# Patient Record
Sex: Female | Born: 1985 | Race: Black or African American | Hispanic: No | Marital: Single | Smoking: Never smoker
Health system: Southern US, Community
[De-identification: ages and names within clinical notes are randomized; demographics above are authoritative.]

## PROBLEM LIST (undated history)

## (undated) ENCOUNTER — Inpatient Hospital Stay (HOSPITAL_COMMUNITY): Payer: Self-pay

## (undated) DIAGNOSIS — Z8619 Personal history of other infectious and parasitic diseases: Secondary | ICD-10-CM

## (undated) DIAGNOSIS — Z87440 Personal history of urinary (tract) infections: Secondary | ICD-10-CM

## (undated) HISTORY — DX: Personal history of urinary (tract) infections: Z87.440

## (undated) HISTORY — PX: NO PAST SURGERIES: SHX2092

## (undated) HISTORY — DX: Personal history of other infectious and parasitic diseases: Z86.19

---
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
## 2007-04-13 ENCOUNTER — Ambulatory Visit (HOSPITAL_COMMUNITY): Admission: RE | Admit: 2007-04-13 | Discharge: 2007-04-13 | Payer: Self-pay | Admitting: Obstetrics & Gynecology

## 2007-04-26 ENCOUNTER — Ambulatory Visit (HOSPITAL_COMMUNITY): Admission: RE | Admit: 2007-04-26 | Discharge: 2007-04-26 | Payer: Self-pay | Admitting: Obstetrics & Gynecology

## 2007-08-20 ENCOUNTER — Inpatient Hospital Stay (HOSPITAL_COMMUNITY): Admission: AD | Admit: 2007-08-20 | Discharge: 2007-08-23 | Payer: Self-pay | Admitting: Obstetrics & Gynecology

## 2009-12-25 ENCOUNTER — Emergency Department (HOSPITAL_COMMUNITY): Admission: EM | Admit: 2009-12-25 | Discharge: 2009-12-25 | Payer: Self-pay | Admitting: Family Medicine

## 2010-11-05 LAB — POCT URINALYSIS DIP (DEVICE)
Bilirubin Urine: NEGATIVE
Glucose, UA: NEGATIVE mg/dL
Ketones, ur: NEGATIVE mg/dL
Nitrite: NEGATIVE
Protein, ur: 100 mg/dL — AB
Specific Gravity, Urine: 1.02 (ref 1.005–1.030)
Urobilinogen, UA: 1 mg/dL (ref 0.0–1.0)
pH: 6 (ref 5.0–8.0)

## 2011-05-08 LAB — CBC
HCT: 29.6 — ABNORMAL LOW
HCT: 31 — ABNORMAL LOW
Hemoglobin: 10.2 — ABNORMAL LOW
Hemoglobin: 10.7 — ABNORMAL LOW
MCHC: 34.4
MCHC: 34.6
MCV: 90
MCV: 90.1
Platelets: 219
Platelets: 257
RBC: 3.28 — ABNORMAL LOW
RBC: 3.45 — ABNORMAL LOW
RDW: 12.4
RDW: 12.7
WBC: 11 — ABNORMAL HIGH
WBC: 13.9 — ABNORMAL HIGH

## 2011-05-08 LAB — RPR: RPR Ser Ql: NONREACTIVE

## 2011-07-14 ENCOUNTER — Inpatient Hospital Stay (HOSPITAL_COMMUNITY)
Admission: AD | Admit: 2011-07-14 | Discharge: 2011-07-14 | Disposition: A | Discharge status: Home / Self Care | Payer: Self-pay | Source: Ambulatory Visit | Attending: Obstetrics & Gynecology | Admitting: Obstetrics & Gynecology

## 2011-07-14 ENCOUNTER — Encounter (HOSPITAL_COMMUNITY): Payer: Self-pay

## 2011-07-14 DIAGNOSIS — J069 Acute upper respiratory infection, unspecified: Secondary | ICD-10-CM | POA: Insufficient documentation

## 2011-07-14 DIAGNOSIS — O99891 Other specified diseases and conditions complicating pregnancy: Secondary | ICD-10-CM | POA: Insufficient documentation

## 2011-07-14 DIAGNOSIS — Z3201 Encounter for pregnancy test, result positive: Secondary | ICD-10-CM

## 2011-07-14 DIAGNOSIS — O210 Mild hyperemesis gravidarum: Secondary | ICD-10-CM | POA: Insufficient documentation

## 2011-07-14 LAB — POCT PREGNANCY, URINE: Preg Test, Ur: POSITIVE

## 2011-07-14 LAB — URINALYSIS, ROUTINE W REFLEX MICROSCOPIC
Glucose, UA: NEGATIVE mg/dL
Hgb urine dipstick: NEGATIVE
Ketones, ur: 40 mg/dL — AB
Leukocytes, UA: NEGATIVE
Protein, ur: NEGATIVE mg/dL
Specific Gravity, Urine: >1.03 — ABNORMAL HIGH (ref 1.005–1.030)
Urobilinogen, UA: 2 mg/dL — ABNORMAL HIGH (ref 0.0–1.0)
pH: 6 (ref 5.0–8.0)

## 2011-07-14 MED ORDER — PROMETHAZINE HCL 25 MG PO TABS: 12.5000 mg | ORAL_TABLET | Freq: Four times a day (QID) | ORAL | Status: DC | PRN

## 2011-07-14 NOTE — Progress Notes (Signed)
Pt states had +upt at home 2 weeks ago, denies bleeding, has had clear vaginal d/c noted, non-odorous. Has had n/v with eating, intermittent, depends on what foods are eaten. Denies pain.

## 2011-07-14 NOTE — Progress Notes (Signed)
Can't really eat, always throwing up.  Has had a period since Sept. Pos home test a couple  wks  Ago..  Has a cold unsure what to take.

## 2011-07-16 ENCOUNTER — Encounter (HOSPITAL_COMMUNITY): Payer: Self-pay | Admitting: *Deleted

## 2011-07-16 NOTE — Progress Notes (Signed)
Pt called states her RX for Phenergan was not called into her Pharmacy. Consulted with Lilyan Punt NP RX called into [facility]. Phenergan 25mg  po #20 no refills. Pt called and notified.

## 2011-08-19 NOTE — L&D Delivery Note (Signed)
Delivery Note At 6:45 PM a viable female, "Jo Graham", was delivered via Vaginal, Spontaneous Delivery (Presentation: Right Occiput Anterior).  APGAR: 8, 9; weight .   Placenta status: Intact, Spontaneous.  Cord: Loose CAN x1, reduced over vtx at delivery, and cord around body/feet, loosely.  3 vessels with the following complications: None.  Cord pH: NA  Anesthesia: Epidural  Episiotomy: None Lacerations: Right 1st degree periurethral Suture Repair: 3.0 monocryl Est. Blood Loss (mL): 200  Mom to postpartum.  Baby to skin to skin Will continue patient on Macrobid as previously prescribed from office, through 02/23/12.Marland Kitchen  [name] 02/15/2012, 7:25 PM

## 2011-09-17 DIAGNOSIS — IMO0002 Reserved for concepts with insufficient information to code with codable children: Secondary | ICD-10-CM | POA: Insufficient documentation

## 2011-09-29 DIAGNOSIS — O09219 Supervision of pregnancy with history of pre-term labor, unspecified trimester: Secondary | ICD-10-CM

## 2011-10-13 DIAGNOSIS — O09219 Supervision of pregnancy with history of pre-term labor, unspecified trimester: Secondary | ICD-10-CM

## 2011-10-13 LAB — OB RESULTS CONSOLE ABO/RH: RH Type: POSITIVE

## 2011-10-13 LAB — OB RESULTS CONSOLE ANTIBODY SCREEN: Antibody Screen: NEGATIVE

## 2011-10-13 LAB — OB RESULTS CONSOLE HEPATITIS B SURFACE ANTIGEN: Hepatitis B Surface Ag: NEGATIVE

## 2011-10-20 ENCOUNTER — Other Ambulatory Visit (INDEPENDENT_AMBULATORY_CARE_PROVIDER_SITE_OTHER): Payer: Medicaid Other

## 2011-10-20 DIAGNOSIS — O09219 Supervision of pregnancy with history of pre-term labor, unspecified trimester: Secondary | ICD-10-CM

## 2011-10-20 DIAGNOSIS — O0930 Supervision of pregnancy with insufficient antenatal care, unspecified trimester: Secondary | ICD-10-CM | POA: Insufficient documentation

## 2011-10-27 ENCOUNTER — Other Ambulatory Visit: Payer: Medicaid Other

## 2011-11-10 ENCOUNTER — Encounter (INDEPENDENT_AMBULATORY_CARE_PROVIDER_SITE_OTHER): Payer: Medicaid Other | Admitting: Obstetrics and Gynecology

## 2011-11-10 DIAGNOSIS — Z3480 Encounter for supervision of other normal pregnancy, unspecified trimester: Secondary | ICD-10-CM

## 2011-11-10 DIAGNOSIS — O09219 Supervision of pregnancy with history of pre-term labor, unspecified trimester: Secondary | ICD-10-CM

## 2011-11-17 ENCOUNTER — Encounter (INDEPENDENT_AMBULATORY_CARE_PROVIDER_SITE_OTHER): Payer: Medicaid Other

## 2011-11-17 ENCOUNTER — Other Ambulatory Visit (INDEPENDENT_AMBULATORY_CARE_PROVIDER_SITE_OTHER): Payer: Medicaid Other

## 2011-11-17 ENCOUNTER — Encounter: Payer: Medicaid Other | Admitting: Obstetrics and Gynecology

## 2011-11-17 DIAGNOSIS — Z331 Pregnant state, incidental: Secondary | ICD-10-CM

## 2011-11-17 DIAGNOSIS — O09219 Supervision of pregnancy with history of pre-term labor, unspecified trimester: Secondary | ICD-10-CM

## 2011-11-17 LAB — CBC: Hemoglobin: 10.6 g/dL — AB (ref 12.0–16.0)

## 2011-11-17 LAB — RPR: RPR: NONREACTIVE

## 2011-11-24 ENCOUNTER — Other Ambulatory Visit: Payer: Medicaid Other

## 2011-11-24 DIAGNOSIS — O09219 Supervision of pregnancy with history of pre-term labor, unspecified trimester: Secondary | ICD-10-CM

## 2011-11-25 ENCOUNTER — Ambulatory Visit (INDEPENDENT_AMBULATORY_CARE_PROVIDER_SITE_OTHER): Payer: Medicaid Other | Admitting: Obstetrics and Gynecology

## 2011-11-25 ENCOUNTER — Telehealth: Payer: Self-pay | Admitting: Obstetrics and Gynecology

## 2011-11-25 VITALS — BP 104/66 | Wt 152.0 lb

## 2011-11-25 DIAGNOSIS — Z8751 Personal history of pre-term labor: Secondary | ICD-10-CM

## 2011-11-25 DIAGNOSIS — Z331 Pregnant state, incidental: Secondary | ICD-10-CM

## 2011-11-25 DIAGNOSIS — N760 Acute vaginitis: Secondary | ICD-10-CM

## 2011-11-25 DIAGNOSIS — B9689 Other specified bacterial agents as the cause of diseases classified elsewhere: Secondary | ICD-10-CM

## 2011-11-25 DIAGNOSIS — A499 Bacterial infection, unspecified: Secondary | ICD-10-CM

## 2011-11-25 LAB — FETAL FIBRONECTIN: Fetal Fibronectin: NEGATIVE

## 2011-11-25 MED ORDER — METRONIDAZOLE 500 MG PO TABS: 500.0000 mg | ORAL_TABLET | Freq: Two times a day (BID) | ORAL | Status: AC

## 2011-11-25 NOTE — Progress Notes (Signed)
Reports "no uc just pressure at times, no srom, with pink spotting no recent intercourse or exam" abd soft gravid, nt EGBUS wnl SSE wet prep +clue neg trich neg hyphae, white discharge, digital exam LTC A 29 week IUP without cervical change with bv P FFN, GC/CHL, GBS to lab, collaboration with Dr. Normand Sloop at office, discusse BMZ if appropriate [facility], CNM

## 2011-11-25 NOTE — Patient Instructions (Signed)
Bacterial Vaginosis Bacterial vaginosis (BV) is a vaginal infection where the normal balance of bacteria in the vagina is disrupted. The normal balance is then replaced by an overgrowth of certain bacteria. There are several different kinds of bacteria that can cause BV. BV is the most common vaginal infection in women of childbearing age. CAUSES   The cause of BV is not fully understood. BV develops when there is an increase or imbalance of harmful bacteria.   Some activities or behaviors can upset the normal balance of bacteria in the vagina and put women at increased risk including:   Having a new sex partner or multiple sex partners.   Douching.   Using an intrauterine device (IUD) for contraception.   It is not clear what role sexual activity plays in the development of BV. However, women that have never had sexual intercourse are rarely infected with BV.  Women do not get BV from toilet seats, bedding, swimming pools or from touching objects around them.  SYMPTOMS   Grey vaginal discharge.   A fish-like odor with discharge, especially after sexual intercourse.   Itching or burning of the vagina and vulva.   Burning or pain with urination.  Some women have no signs or symptoms at all.  Preventing Preterm Labor Preterm labor is when a pregnant woman has contractions that cause the cervix to open, shorten, and thin before 37 weeks of pregnancy. You will have regular contractions (tightening) 2 to 3 minutes apart. This usually causes discomfort or pain. HOME CARE Eat a healthy diet.  Take your vitamins as told by your doctor.  Drink enough fluids to keep your pee (urine) clear or pale yellow every day.  Get rest and sleep.  Do not have sex if you are at high risk for preterm labor.  Follow your doctor's advice about activity, medicines, and tests.  Avoid stress.  Avoid hard labor or exercise that lasts for a long time.  Do not smoke.  GET HELP RIGHT AWAY IF:  You are having  contractions.  You have belly (abdominal) pain.  You have bleeding from your vagina.  You have pain when you pee (urinate).  You have abnormal discharge from your vagina.  You have a temperature by mouth above 102 F (38.9 C).  MAKE SURE YOU: Understand these instructions.  Will watch your condition.  Will get help if you are not doing well or get worse.  Document Released: 10/31/2008 Document Revised: 07/24/2011 Document Reviewed: 10/31/2008 [facility] Patient Information 2012 [place], [place]. DIAGNOSIS  Your caregiver must examine the vagina for signs of BV. Your caregiver will perform lab tests and look at the sample of vaginal fluid through a microscope. They will look for bacteria and abnormal cells (clue cells), a pH test higher than 4.5, and a positive amine test all associated with BV.  RISKS AND COMPLICATIONS   Pelvic inflammatory disease (PID).   Infections following gynecology surgery.   Developing HIV.   Developing herpes virus.  TREATMENT  Sometimes BV will clear up without treatment. However, all women with symptoms of BV should be treated to avoid complications, especially if gynecology surgery is planned. Female partners generally do not need to be treated. However, BV may spread between female sex partners so treatment is helpful in preventing a recurrence of BV.   BV may be treated with antibiotics. The antibiotics come in either pill or vaginal cream forms. Either can be used with nonpregnant or pregnant women, but the recommended dosages differ. These  antibiotics are not harmful to the baby.   BV can recur after treatment. If this happens, a second round of antibiotics will often be prescribed.   Treatment is important for pregnant women. If not treated, BV can cause a premature delivery, especially for a pregnant woman who had a premature birth in the past. All pregnant women who have symptoms of BV should be checked and treated.   For chronic reoccurrence of BV,  treatment with a type of prescribed gel vaginally twice a week is helpful.  HOME CARE INSTRUCTIONS   Finish all medication as directed by your caregiver.   Do not have sex until treatment is completed.   Tell your sexual partner that you have a vaginal infection. They should see their caregiver and be treated if they have problems, such as a mild rash or itching.   Practice safe sex. Use condoms. Only have 1 sex partner.  PREVENTION  Basic prevention steps can help reduce the risk of upsetting the natural balance of bacteria in the vagina and developing BV:  Do not have sexual intercourse (be abstinent).   Do not douche.   Use all of the medicine prescribed for treatment of BV, even if the signs and symptoms go away.   Tell your sex partner if you have BV. That way, they can be treated, if needed, to prevent reoccurrence.  SEEK MEDICAL CARE IF:   Your symptoms are not improving after 3 days of treatment.   You have increased discharge, pain, or fever.  MAKE SURE YOU:   Understand these instructions.   Will watch your condition.   Will get help right away if you are not doing well or get worse.  FOR MORE INFORMATION  Division of STD Prevention (DSTDP), Centers for Disease Control and Prevention: [employer] American Social Health Association (ASHA): www.ashastd.org  Document Released: 08/04/2005 Document Revised: 07/24/2011 Document Reviewed: 01/25/2009 [facility] Patient Information 2012 [place], [place].

## 2011-11-25 NOTE — Progress Notes (Signed)
Having pressure Having spotting (started today)

## 2011-11-26 ENCOUNTER — Telehealth: Payer: Self-pay | Admitting: Obstetrics and Gynecology

## 2011-11-26 ENCOUNTER — Telehealth: Payer: Self-pay

## 2011-11-26 LAB — GC/CHLAMYDIA PROBE AMP, GENITAL: GC Probe Amp, Genital: NEGATIVE

## 2011-11-26 NOTE — Telephone Encounter (Signed)
Spoke with pt rgd msg pt wants ffn results  informed ffn neg pt voice understanding. Rolla Plate

## 2011-11-27 LAB — STREP B DNA PROBE: GBSP: NEGATIVE

## 2011-12-01 ENCOUNTER — Encounter: Payer: Self-pay | Admitting: Registered Nurse

## 2011-12-01 ENCOUNTER — Other Ambulatory Visit: Payer: Medicaid Other

## 2011-12-01 ENCOUNTER — Ambulatory Visit (INDEPENDENT_AMBULATORY_CARE_PROVIDER_SITE_OTHER): Payer: Medicaid Other | Admitting: Registered Nurse

## 2011-12-01 VITALS — BP 122/60 | Wt 152.0 lb

## 2011-12-01 DIAGNOSIS — O09219 Supervision of pregnancy with history of pre-term labor, unspecified trimester: Secondary | ICD-10-CM

## 2011-12-01 DIAGNOSIS — O0990 Supervision of high risk pregnancy, unspecified, unspecified trimester: Secondary | ICD-10-CM | POA: Insufficient documentation

## 2011-12-01 DIAGNOSIS — Z331 Pregnant state, incidental: Secondary | ICD-10-CM

## 2011-12-01 HISTORY — DX: Supervision of high risk pregnancy, unspecified, unspecified trimester: O09.90

## 2011-12-01 MED ORDER — HYDROXYPROGESTERONE CAPROATE 250 MG/ML IM OIL
250.0000 mg | TOPICAL_OIL | Freq: Once | INTRAMUSCULAR | Status: AC
  Administered 2011-12-01: 250 mg via INTRAMUSCULAR

## 2011-12-01 NOTE — Progress Notes (Signed)
Denies complaints. Doing well. Denies PTL sx. Rev'd PTL sx and sx to report. Understanding verbalized. ROB & 17P x 1 week.

## 2011-12-04 NOTE — Telephone Encounter (Signed)
Pt call completed,

## 2011-12-08 DIAGNOSIS — IMO0002 Reserved for concepts with insufficient information to code with codable children: Secondary | ICD-10-CM

## 2011-12-08 DIAGNOSIS — O0930 Supervision of pregnancy with insufficient antenatal care, unspecified trimester: Secondary | ICD-10-CM

## 2011-12-08 DIAGNOSIS — R87612 Low grade squamous intraepithelial lesion on cytologic smear of cervix (LGSIL): Secondary | ICD-10-CM

## 2011-12-09 ENCOUNTER — Ambulatory Visit (INDEPENDENT_AMBULATORY_CARE_PROVIDER_SITE_OTHER): Payer: Medicaid Other | Admitting: Obstetrics and Gynecology

## 2011-12-09 DIAGNOSIS — B9689 Other specified bacterial agents as the cause of diseases classified elsewhere: Secondary | ICD-10-CM

## 2011-12-09 DIAGNOSIS — A499 Bacterial infection, unspecified: Secondary | ICD-10-CM

## 2011-12-09 DIAGNOSIS — O09219 Supervision of pregnancy with history of pre-term labor, unspecified trimester: Secondary | ICD-10-CM

## 2011-12-09 DIAGNOSIS — N760 Acute vaginitis: Secondary | ICD-10-CM

## 2011-12-09 MED ORDER — HYDROXYPROGESTERONE CAPROATE 250 MG/ML IM OIL
250.0000 mg | TOPICAL_OIL | Freq: Once | INTRAMUSCULAR | Status: AC
  Administered 2011-12-09: 250 mg via INTRAMUSCULAR

## 2011-12-09 NOTE — Progress Notes (Signed)
Addendum:  Growth [place] for S<D  Pt needs sched for colpo

## 2011-12-09 NOTE — Progress Notes (Signed)
Pt need colpo per SL pt next visit 12/18/11 with SR procedure room unavailable at this visit pt to sch colpo when leaving 12/18/11 appt

## 2011-12-09 NOTE — Progress Notes (Signed)
Jo Graham [redacted]w[redacted]d   Doing well GFM Occ mild ctx, no VB or LOF rv'd labs - 1hr gtt normal hgb 10. 6 RPR NR  Plan: TOC for BV today - wet preg neg, cx appears closed, slightly friable, digital exam deferred Growth [place] 17p weekly until 36wks FKC and PTL sx's rv'd

## 2011-12-09 NOTE — Patient Instructions (Signed)
Iron-Rich Diet  An iron-rich diet contains foods that are good sources of iron. Iron is an important mineral that helps your body produce hemoglobin. Hemoglobin is a protein in red blood cells that carries oxygen to the body's tissues. Sometimes, the iron level in your blood can be low. This may be caused by:   A lack of iron in your diet.   Blood loss.   Times of growth, such as during pregnancy or during a child's growth and development.  Low levels of iron can cause a decrease in the number of red blood cells. This can result in iron deficiency anemia. Iron deficiency anemia symptoms include:   Tiredness.   Weakness.   Irritability.   Increased chance of infection.  Here are some recommendations for daily iron intake:   Males older than 24 years of age need 8 mg of iron per day.   Women ages 19 to 50 need 18 mg of iron per day.   Pregnant women need 27 mg of iron per day, and women who are over 19 years of age and breastfeeding need 9 mg of iron per day.   Women over the age of 50 need 8 mg of iron per day.  SOURCES OF IRON  There are 2 types of iron that are found in food: heme iron and nonheme iron. Heme iron is absorbed by the body better than nonheme iron. Heme iron is found in meat, poultry, and fish. Nonheme iron is found in grains, beans, and vegetables.  Heme Iron Sources  Food / Iron (mg)   Chicken liver, 3 oz (85 g)/ 10 mg   Beef liver, 3 oz (85 g)/ 5.5 mg   Oysters, 3 oz (85 g)/ 8 mg   Beef, 3 oz (85 g)/ 2 to 3 mg   Shrimp, 3 oz (85 g)/ 2.8 mg   Turkey, 3 oz (85 g)/ 2 mg   Chicken, 3 oz (85 g) / 1 mg   Fish (tuna, halibut), 3 oz (85 g)/ 1 mg   Pork, 3 oz (85 g)/ 0.9 mg  Nonheme Iron Sources  Food / Iron (mg)   Ready-to-eat breakfast cereal, iron-fortified / 3.9 to 7 mg   Tofu,  cup / 3.4 mg   Kidney beans,  cup / 2.6 mg   Baked potato with skin / 2.7 mg   Asparagus,  cup / 2.2 mg   Avocado / 2 mg   Dried peaches,  cup / 1.6 mg   Raisins,  cup / 1.5 mg   Soy milk, 1 cup  / 1.5 mg   Whole-wheat bread, 1 slice / 1.2 mg   Spinach, 1 cup / 0.8 mg   Broccoli,  cup / 0.6 mg  IRON ABSORPTION  Certain foods can decrease the body's absorption of iron. Try to avoid these foods and beverages while eating meals with iron-containing foods:   Coffee.   Tea.   Fiber.   Soy.  Foods containing vitamin C can help increase the amount of iron your body absorbs from iron sources, especially from nonheme sources. Eat foods with vitamin C along with iron-containing foods to increase your iron absorption. Foods that are high in vitamin C include many fruits and vegetables. Some good sources are:   Fresh orange juice.   Oranges.   Strawberries.   Mangoes.   Grapefruit.   Red bell peppers.   Green bell peppers.   Broccoli.   Potatoes with skin.   Tomato juice.  Document   LLC.Fetal Movement Counts Patient Name: __________________________________________________ Patient Due Date: ____________________ Jo Graham counts is highly recommended in high risk pregnancies, but it is a good idea for every pregnant woman to do. Start counting fetal movements at 28 weeks of the pregnancy. Fetal movements increase after eating a full meal or eating or drinking something sweet (the blood sugar is higher). It is also important to drink plenty of fluids (well hydrated) before doing the count. Lie on your left side because it helps with the circulation or you can sit in a comfortable chair with your arms over your belly (abdomen) with no distractions around you. DOING THE COUNT  Try to do the count the same time of day each time you do it.   Mark the day and time, then see how long it takes for you to feel 10 movements (kicks, flutters, swishes, rolls). You should have at least 10 movements within 2 hours. You will most likely feel 10  movements in much less than 2 hours. If you do not, wait an hour and count again. After a couple of days you will see a pattern.   What you are looking for is a change in the pattern or not enough counts in 2 hours. Is it taking longer in time to reach 10 movements?  SEEK MEDICAL CARE IF:  You feel less than 10 counts in 2 hours. Tried twice.   No movement in one hour.   The pattern is changing or taking longer each day to reach 10 counts in 2 hours.   You feel the baby is not moving as it usually does.  Date: ____________ Movements: ____________ Start time: ____________ Doreatha Martin time: ____________  Date: ____________ Movements: ____________ Start time: ____________ Doreatha Martin time: ____________ Date: ____________ Movements: ____________ Start time: ____________ Doreatha Martin time: ____________ Date: ____________ Movements: ____________ Start time: ____________ Doreatha Martin time: ____________ Date: ____________ Movements: ____________ Start time: ____________ Doreatha Martin time: ____________ Date: ____________ Movements: ____________ Start time: ____________ Doreatha Martin time: ____________ Date: ____________ Movements: ____________ Start time: ____________ Doreatha Martin time: ____________ Date: ____________ Movements: ____________ Start time: ____________ Doreatha Martin time: ____________  Date: ____________ Movements: ____________ Start time: ____________ Doreatha Martin time: ____________ Date: ____________ Movements: ____________ Start time: ____________ Doreatha Martin time: ____________ Date: ____________ Movements: ____________ Start time: ____________ Doreatha Martin time: ____________ Date: ____________ Movements: ____________ Start time: ____________ Doreatha Martin time: ____________ Date: ____________ Movements: ____________ Start time: ____________ Doreatha Martin time: ____________ Date: ____________ Movements: ____________ Start time: ____________ Doreatha Martin time: ____________ Date: ____________ Movements: ____________ Start time: ____________ Doreatha Martin time: ____________    Date: ____________ Movements: ____________ Start time: ____________ Doreatha Martin time: ____________ Date: ____________ Movements: ____________ Start time: ____________ Doreatha Martin time: ____________ Date: ____________ Movements: ____________ Start time: ____________ Doreatha Martin time: ____________ Date: ____________ Movements: ____________ Start time: ____________ Doreatha Martin time: ____________ Date: ____________ Movements: ____________ Start time: ____________ Doreatha Martin time: ____________ Date: ____________ Movements: ____________ Start time: ____________ Doreatha Martin time: ____________ Date: ____________ Movements: ____________ Start time: ____________ Doreatha Martin time: ____________  Date: ____________ Movements: ____________ Start time: ____________ Doreatha Martin time: ____________ Date: ____________ Movements: ____________ Start time: ____________ Doreatha Martin time: ____________ Date: ____________ Movements: ____________ Start time: ____________ Doreatha Martin time: ____________ Date: ____________ Movements: ____________ Start time: ____________ Doreatha Martin time: ____________ Date: ____________ Movements: ____________ Start time: ____________ Doreatha Martin time: ____________ Date: ____________ Movements: ____________ Start time: ____________ Doreatha Martin time: ____________ Date: ____________ Movements: ____________ Start time: ____________ Doreatha Martin time: ____________  Date: ____________ Movements: ____________ Start time: ____________ Doreatha Martin time: ____________ Date: ____________ Movements: ____________ Start time: ____________ Doreatha Martin time: ____________ Date: ____________ Movements: ____________ Start  time: ____________ Doreatha Martin time: ____________ Date: ____________ Movements: ____________ Start time: ____________ Doreatha Martin time: ____________ Date: ____________ Movements: ____________ Start time: ____________ Doreatha Martin time: ____________ Date: ____________ Movements: ____________ Start time: ____________ Doreatha Martin time: ____________ Date: ____________ Movements: ____________  Start time: ____________ Doreatha Martin time: ____________  Date: ____________ Movements: ____________ Start time: ____________ Doreatha Martin time: ____________ Date: ____________ Movements: ____________ Start time: ____________ Doreatha Martin time: ____________ Date: ____________ Movements: ____________ Start time: ____________ Doreatha Martin time: ____________ Date: ____________ Movements: ____________ Start time: ____________ Doreatha Martin time: ____________ Date: ____________ Movements: ____________ Start time: ____________ Doreatha Martin time: ____________ Date: ____________ Movements: ____________ Start time: ____________ Doreatha Martin time: ____________ Date: ____________ Movements: ____________ Start time: ____________ Doreatha Martin time: ____________  Date: ____________ Movements: ____________ Start time: ____________ Doreatha Martin time: ____________ Date: ____________ Movements: ____________ Start time: ____________ Doreatha Martin time: ____________ Date: ____________ Movements: ____________ Start time: ____________ Doreatha Martin time: ____________ Date: ____________ Movements: ____________ Start time: ____________ Doreatha Martin time: ____________ Date: ____________ Movements: ____________ Start time: ____________ Doreatha Martin time: ____________ Date: ____________ Movements: ____________ Start time: ____________ Doreatha Martin time: ____________ Date: ____________ Movements: ____________ Start time: ____________ Doreatha Martin time: ____________  Date: ____________ Movements: ____________ Start time: ____________ Doreatha Martin time: ____________ Date: ____________ Movements: ____________ Start time: ____________ Doreatha Martin time: ____________ Date: ____________ Movements: ____________ Start time: ____________ Doreatha Martin time: ____________ Date: ____________ Movements: ____________ Start time: ____________ Doreatha Martin time: ____________ Date: ____________ Movements: ____________ Start time: ____________ Doreatha Martin time: ____________ Date: ____________ Movements: ____________ Start time: ____________ Doreatha Martin time:  ____________ Document Released: 09/03/2006 Document Revised: 07/24/2011 Document Reviewed: 03/06/2009 [employer] Patient Information 2012 [place].Preterm Labor Preterm labor is when labor starts at less than 37 weeks of pregnancy. The normal length of a pregnancy is 39 to 41 weeks. CAUSES Often, there is no identifiable underlying cause as to why a woman goes into preterm labor. However, one of the most common known causes of preterm labor is infection. Infections of the uterus, cervix, vagina, amniotic sac, bladder, kidney, or even the lungs (pneumonia) can cause labor to start. Other causes of preterm labor include:  Urogenital infections, such as yeast infections and bacterial vaginosis.   Uterine abnormalities (uterine shape, uterine septum, fibroids, bleeding from the placenta).   A cervix that has been operated on and opens prematurely.   Malformations in the baby.   Multiple gestations (twins, triplets, and so on).   Breakage of the amniotic sac.  Additional risk factors for preterm labor include:  Previous history of preterm labor.   Premature rupture of membranes (PROM).   A placenta that covers the opening of the cervix (placenta previa).   A placenta that separates from the uterus (placenta abruption).   A cervix that is too weak to hold the baby in the uterus (incompetence cervix).   Having too much fluid in the amniotic sac (polyhydramnios).   Taking illegal drugs or smoking while pregnant.   Not gaining enough weight while pregnant.   Women younger than 63 and older than 24 years old.   Low socioeconomic status.   African-American ethnicity.  SYMPTOMS Signs and symptoms of preterm labor include:  Menstrual-like cramps.   Contractions that are 30 to 70 seconds apart, become very regular, closer together, and are more intense and painful.   Contractions that start on the top of the uterus and spread down to the lower abdomen and back.   A sense of  increased pelvic pressure or back pain.   A watery or bloody discharge that comes from the vagina.  DIAGNOSIS  A diagnosis can be confirmed by:  A vaginal exam.   An ultrasound of the cervix.   Sampling (swabbing) cervico-vaginal secretions. These samples can be tested for the presence of fetal fibronectin. This is a protein found in cervical discharge which is associated with preterm labor.   Fetal monitoring.  TREATMENT  Depending on the length of the pregnancy and other circumstances, a caregiver may suggest bed rest. If necessary, there are medicines that can be given to stop contractions and to quicken fetal lung maturity. If labor happens before 34 weeks of pregnancy, a prolonged hospital stay may be recommended. Treatment depends on the condition of both the mother and baby. PREVENTION There are some things a mother can do to lower the risk of preterm labor in future pregnancies. A woman can:   Stop smoking.   Maintain healthy weight gain and avoid chemicals and drugs that are not necessary.   Be watchful for any type of infection.   Inform her caregiver if she has a known history of preterm labor.  Document Released: 10/25/2003 Document Revised: 07/24/2011 Document Reviewed: 11/29/2010 [facility] Patient Information 2012 [place], [place].

## 2011-12-16 ENCOUNTER — Other Ambulatory Visit: Payer: Medicaid Other

## 2011-12-18 ENCOUNTER — Other Ambulatory Visit: Payer: Self-pay

## 2011-12-18 ENCOUNTER — Other Ambulatory Visit: Payer: Medicaid Other

## 2011-12-18 ENCOUNTER — Encounter: Payer: Self-pay | Admitting: Obstetrics and Gynecology

## 2011-12-18 ENCOUNTER — Ambulatory Visit (INDEPENDENT_AMBULATORY_CARE_PROVIDER_SITE_OTHER): Payer: Medicaid Other | Admitting: Obstetrics and Gynecology

## 2011-12-18 ENCOUNTER — Ambulatory Visit (INDEPENDENT_AMBULATORY_CARE_PROVIDER_SITE_OTHER): Payer: Medicaid Other

## 2011-12-18 VITALS — BP 110/56 | Wt 153.0 lb

## 2011-12-18 DIAGNOSIS — O09219 Supervision of pregnancy with history of pre-term labor, unspecified trimester: Secondary | ICD-10-CM

## 2011-12-18 DIAGNOSIS — O26849 Uterine size-date discrepancy, unspecified trimester: Secondary | ICD-10-CM

## 2011-12-18 DIAGNOSIS — IMO0002 Reserved for concepts with insufficient information to code with codable children: Secondary | ICD-10-CM

## 2011-12-18 DIAGNOSIS — R87612 Low grade squamous intraepithelial lesion on cytologic smear of cervix (LGSIL): Secondary | ICD-10-CM

## 2011-12-18 DIAGNOSIS — Z8751 Personal history of pre-term labor: Secondary | ICD-10-CM

## 2011-12-18 LAB — US OB FOLLOW UP

## 2011-12-18 NOTE — Progress Notes (Signed)
Pt. Stated no issues today. Pt needs 17p shot today missed shot on Tuesday .

## 2011-12-18 NOTE — Progress Notes (Signed)
sono for S<D:  EFW 4 lbs 6 oz   40%   AFI normal   Cervix= 3.92 cm   [facility] Reminded to schedule Colpo

## 2011-12-25 ENCOUNTER — Other Ambulatory Visit: Payer: Medicaid Other

## 2011-12-25 ENCOUNTER — Encounter: Payer: Self-pay | Admitting: Obstetrics and Gynecology

## 2011-12-25 DIAGNOSIS — O09219 Supervision of pregnancy with history of pre-term labor, unspecified trimester: Secondary | ICD-10-CM

## 2011-12-25 MED ORDER — HYDROXYPROGESTERONE CAPROATE 250 MG/ML IM OIL
250.0000 mg | TOPICAL_OIL | Freq: Once | INTRAMUSCULAR | Status: AC
  Administered 2011-12-25: 250 mg via INTRAMUSCULAR

## 2012-01-01 ENCOUNTER — Encounter: Payer: Self-pay | Admitting: Obstetrics and Gynecology

## 2012-01-01 ENCOUNTER — Ambulatory Visit (INDEPENDENT_AMBULATORY_CARE_PROVIDER_SITE_OTHER): Payer: Medicaid Other | Admitting: Obstetrics and Gynecology

## 2012-01-01 VITALS — BP 110/60 | Wt 156.0 lb

## 2012-01-01 DIAGNOSIS — Z8751 Personal history of pre-term labor: Secondary | ICD-10-CM

## 2012-01-01 DIAGNOSIS — O09219 Supervision of pregnancy with history of pre-term labor, unspecified trimester: Secondary | ICD-10-CM

## 2012-01-01 MED ORDER — HYDROXYPROGESTERONE CAPROATE 250 MG/ML IM OIL
250.0000 mg | TOPICAL_OIL | Freq: Once | INTRAMUSCULAR | Status: AC
  Administered 2012-01-01: 250 mg via INTRAMUSCULAR

## 2012-01-01 NOTE — Progress Notes (Signed)
Doing well.  Return office in one week. Patient needs colposcopy and biopsies postpartum.

## 2012-01-01 NOTE — Progress Notes (Signed)
Pt unable to urinate will go before she leaves 17p given without difficulty

## 2012-01-13 ENCOUNTER — Ambulatory Visit: Payer: Medicaid Other

## 2012-01-13 ENCOUNTER — Encounter: Payer: Self-pay | Admitting: Obstetrics and Gynecology

## 2012-01-13 ENCOUNTER — Ambulatory Visit (INDEPENDENT_AMBULATORY_CARE_PROVIDER_SITE_OTHER): Payer: Medicaid Other | Admitting: Obstetrics and Gynecology

## 2012-01-13 ENCOUNTER — Telehealth: Payer: Self-pay | Admitting: Obstetrics and Gynecology

## 2012-01-13 VITALS — BP 104/64 | Wt 157.0 lb

## 2012-01-13 DIAGNOSIS — O0930 Supervision of pregnancy with insufficient antenatal care, unspecified trimester: Secondary | ICD-10-CM

## 2012-01-13 DIAGNOSIS — Z363 Encounter for antenatal screening for malformations: Secondary | ICD-10-CM

## 2012-01-13 DIAGNOSIS — O368190 Decreased fetal movements, unspecified trimester, not applicable or unspecified: Secondary | ICD-10-CM

## 2012-01-13 DIAGNOSIS — Z1389 Encounter for screening for other disorder: Secondary | ICD-10-CM

## 2012-01-13 DIAGNOSIS — Z3687 Encounter for antenatal screening for uncertain dates: Secondary | ICD-10-CM

## 2012-01-13 DIAGNOSIS — K649 Unspecified hemorrhoids: Secondary | ICD-10-CM

## 2012-01-13 DIAGNOSIS — R87612 Low grade squamous intraepithelial lesion on cytologic smear of cervix (LGSIL): Secondary | ICD-10-CM

## 2012-01-13 DIAGNOSIS — IMO0002 Reserved for concepts with insufficient information to code with codable children: Secondary | ICD-10-CM

## 2012-01-13 DIAGNOSIS — Z331 Pregnant state, incidental: Secondary | ICD-10-CM

## 2012-01-13 LAB — POCT WET PREP (WET MOUNT): Clue Cells Wet Prep Whiff POC: NEGATIVE

## 2012-01-13 LAB — POCT URINALYSIS DIPSTICK
Bilirubin, UA: NEGATIVE
Blood, UA: NEGATIVE
Glucose, UA: NEGATIVE
Ketones, UA: NEGATIVE
Nitrite, UA: NEGATIVE
Protein, UA: NEGATIVE
Spec Grav, UA: 1.01
Urobilinogen, UA: NEGATIVE
pH, UA: 8

## 2012-01-13 NOTE — Progress Notes (Signed)
Addended by: Einar Crow on: 01/13/2012 02:57 PM   Modules accepted: Orders

## 2012-01-13 NOTE — Patient Instructions (Signed)
Normal Labor and Delivery Your caregiver must first be sure you are in labor. Signs of labor include:  You may pass what is called "the mucus plug" before labor begins. This is a small amount of blood stained mucus.   Regular uterine contractions.   The time between contractions get closer together.   The discomfort and pain gradually gets more intense.   Pains are mostly located in the back.   Pains get worse when walking.   The cervix (the opening of the uterus becomes thinner (begins to efface) and opens up (dilates).  Once you are in labor and admitted into the hospital or care center, your caregiver will do the following:  A complete physical examination.   Check your vital signs (blood pressure, pulse, temperature and the fetal heart rate).   Do a vaginal examination (using a sterile glove and lubricant) to determine:   The position (presentation) of the baby (head [vertex] or buttock first).   The level (station) of the baby's head in the birth canal.   The effacement and dilatation of the cervix.   You may have your pubic hair shaved and be given an enema depending on your caregiver and the circumstance.   An electronic monitor is usually placed on your abdomen. The monitor follows the length and intensity of the contractions, as well as the baby's heart rate.   Usually, your caregiver will insert an IV in your arm with a bottle of sugar water. This is done as a precaution so that medications can be given to you quickly during labor or delivery.  NORMAL LABOR AND DELIVERY IS DIVIDED UP INTO 3 STAGES: First Stage This is when regular contractions begin and the cervix begins to efface and dilate. This stage can last from 3 to 15 hours. The end of the first stage is when the cervix is 100% effaced and 10 centimeters dilated. Pain medications may be given by   Injection (morphine, demerol, etc.)   Regional anesthesia (spinal, caudal or epidural, anesthetics given in  different locations of the spine). Paracervical pain medication may be given, which is an injection of and anesthetic on each side of the cervix.  A pregnant woman may request to have "Natural Childbirth" which is not to have any medications or anesthesia during her labor and delivery. Second Stage This is when the baby comes down through the birth canal (vagina) and is born. This can take 1 to 4 hours. As the baby's head comes down through the birth canal, you may feel like you are going to have a bowel movement. You will get the urge to bear down and push until the baby is delivered. As the baby's head is being delivered, the caregiver will decide if an episiotomy (a cut in the perineum and vagina area) is needed to prevent tearing of the tissue in this area. The episiotomy is sewn up after the delivery of the baby and placenta. Sometimes a mask with nitrous oxide is given for the mother to breath during the delivery of the baby to help if there is too much pain. The end of Stage 2 is when the baby is fully delivered. Then when the umbilical cord stops pulsating it is clamped and cut. Third Stage The third stage begins after the baby is completely delivered and ends after the placenta (afterbirth) is delivered. This usually takes 5 to 30 minutes. After the placenta is delivered, a medication is given either by intravenous or injection to help contract   the uterus and prevent bleeding. The third stage is not painful and pain medication is usually not necessary. If an episiotomy was done, it is repaired at this time. After the delivery, the mother is watched and monitored closely for 1 to 2 hours to make sure there is no postpartum bleeding (hemorrhage). If there is a lot of bleeding, medication is given to contract the uterus and stop the bleeding. Document Released: 05/13/2008 Document Revised: 07/24/2011 Document Reviewed: 05/13/2008 [employer] Patient Information 2012 [employer].Fetal Movement  Counts Patient Name: __________________________________________________ Patient Due Date: ____________________ Kick counts is highly recommended in high risk pregnancies, but it is a good idea for every pregnant woman to do. Start counting fetal movements at 28 weeks of the pregnancy. Fetal movements increase after eating a full meal or eating or drinking something sweet (the blood sugar is higher). It is also important to drink plenty of fluids (well hydrated) before doing the count. Lie on your left side because it helps with the circulation or you can sit in a comfortable chair with your arms over your belly (abdomen) with no distractions around you. DOING THE COUNT  Try to do the count the same time of day each time you do it.   Mark the day and time, then see how long it takes for you to feel 10 movements (kicks, flutters, swishes, rolls). You should have at least 10 movements within 2 hours. You will most likely feel 10 movements in much less than 2 hours. If you do not, wait an hour and count again. After a couple of days you will see a pattern.   What you are looking for is a change in the pattern or not enough counts in 2 hours. Is it taking longer in time to reach 10 movements?  SEEK MEDICAL CARE IF:  You feel less than 10 counts in 2 hours. Tried twice.   No movement in one hour.   The pattern is changing or taking longer each day to reach 10 counts in 2 hours.   You feel the baby is not moving as it usually does.  Date: ____________ Movements: ____________ Start time: ____________ Finish time: ____________  Date: ____________ Movements: ____________ Start time: ____________ Finish time: ____________ Date: ____________ Movements: ____________ Start time: ____________ Finish time: ____________ Date: ____________ Movements: ____________ Start time: ____________ Finish time: ____________ Date: ____________ Movements: ____________ Start time: ____________ Finish time:  ____________ Date: ____________ Movements: ____________ Start time: ____________ Finish time: ____________ Date: ____________ Movements: ____________ Start time: ____________ Finish time: ____________ Date: ____________ Movements: ____________ Start time: ____________ Finish time: ____________  Date: ____________ Movements: ____________ Start time: ____________ Finish time: ____________ Date: ____________ Movements: ____________ Start time: ____________ Finish time: ____________ Date: ____________ Movements: ____________ Start time: ____________ Finish time: ____________ Date: ____________ Movements: ____________ Start time: ____________ Finish time: ____________ Date: ____________ Movements: ____________ Start time: ____________ Finish time: ____________ Date: ____________ Movements: ____________ Start time: ____________ Finish time: ____________ Date: ____________ Movements: ____________ Start time: ____________ Finish time: ____________  Date: ____________ Movements: ____________ Start time: ____________ Finish time: ____________ Date: ____________ Movements: ____________ Start time: ____________ Finish time: ____________ Date: ____________ Movements: ____________ Start time: ____________ Finish time: ____________ Date: ____________ Movements: ____________ Start time: ____________ Finish time: ____________ Date: ____________ Movements: ____________ Start time: ____________ Finish time: ____________ Date: ____________ Movements: ____________ Start time: ____________ Finish time: ____________ Date: ____________ Movements: ____________ Start time: ____________ Finish time: ____________  Date: ____________ Movements: ____________ Start time: ____________ Finish time: ____________ Date: ____________   Movements: ____________ Start time: ____________ Finish time: ____________ Date: ____________ Movements: ____________ Start time: ____________ Finish time: ____________ Date: ____________ Movements:  ____________ Start time: ____________ Finish time: ____________ Date: ____________ Movements: ____________ Start time: ____________ Finish time: ____________ Date: ____________ Movements: ____________ Start time: ____________ Finish time: ____________ Date: ____________ Movements: ____________ Start time: ____________ Finish time: ____________  Date: ____________ Movements: ____________ Start time: ____________ Finish time: ____________ Date: ____________ Movements: ____________ Start time: ____________ Finish time: ____________ Date: ____________ Movements: ____________ Start time: ____________ Finish time: ____________ Date: ____________ Movements: ____________ Start time: ____________ Finish time: ____________ Date: ____________ Movements: ____________ Start time: ____________ Finish time: ____________ Date: ____________ Movements: ____________ Start time: ____________ Finish time: ____________ Date: ____________ Movements: ____________ Start time: ____________ Finish time: ____________  Date: ____________ Movements: ____________ Start time: ____________ Finish time: ____________ Date: ____________ Movements: ____________ Start time: ____________ Finish time: ____________ Date: ____________ Movements: ____________ Start time: ____________ Finish time: ____________ Date: ____________ Movements: ____________ Start time: ____________ Finish time: ____________ Date: ____________ Movements: ____________ Start time: ____________ Finish time: ____________ Date: ____________ Movements: ____________ Start time: ____________ Finish time: ____________ Date: ____________ Movements: ____________ Start time: ____________ Finish time: ____________  Date: ____________ Movements: ____________ Start time: ____________ Finish time: ____________ Date: ____________ Movements: ____________ Start time: ____________ Finish time: ____________ Date: ____________ Movements: ____________ Start time: ____________ Finish  time: ____________ Date: ____________ Movements: ____________ Start time: ____________ Finish time: ____________ Date: ____________ Movements: ____________ Start time: ____________ Finish time: ____________ Date: ____________ Movements: ____________ Start time: ____________ Finish time: ____________ Date: ____________ Movements: ____________ Start time: ____________ Finish time: ____________  Date: ____________ Movements: ____________ Start time: ____________ Finish time: ____________ Date: ____________ Movements: ____________ Start time: ____________ Finish time: ____________ Date: ____________ Movements: ____________ Start time: ____________ Finish time: ____________ Date: ____________ Movements: ____________ Start time: ____________ Finish time: ____________ Date: ____________ Movements: ____________ Start time: ____________ Finish time: ____________ Date: ____________ Movements: ____________ Start time: ____________ Finish time: ____________ Document Released: 09/03/2006 Document Revised: 07/24/2011 Document Reviewed: 03/06/2009 Exit[employer] Patient Information 2012 Exit[employer], LLCBacterial Vaginosis Bacterial vaginosis (BV) is a vaginal infection where the normal balance of bacteria in the vagina is disrupted. The normal balance is then replaced by an overgrowth of certain bacteria. There are several different kinds of bacteria that can cause BV. BV is the most common vaginal infection in women of childbearing age. CAUSES   The cause of BV is not fully understood. BV develops when there is an increase or imbalance of harmful bacteria.   Some activities or behaviors can upset the normal balance of bacteria in the vagina and put women at increased risk including:   Having a new sex partner or multiple sex partners.   Douching.   Using an intrauterine device (IUD) for contraception.   It is not clear what role sexual activity plays in the development of BV. However, women that have never had  sexual intercourse are rarely infected with BV.  Women do not get BV from toilet seats, bedding, swimming pools or from touching objects around them.  SYMPTOMS   Grey vaginal discharge.   A fish-like odor with discharge, especially after sexual intercourse.   Itching or burning of the vagina and vulva.   Burning or pain with urination.   Some women have no signs or symptoms at all.  DIAGNOSIS  Your caregiver must examine the vagina for signs of BV. Your caregiver will perform lab tests and look at the sample of vaginal fluid through a microscope. They will look for   bacteria and abnormal cells (clue cells), a pH test higher than 4.5, and a positive amine test all associated with BV.  RISKS AND COMPLICATIONS   Pelvic inflammatory disease (PID).   Infections following gynecology surgery.   Developing HIV.   Developing herpes virus.  TREATMENT  Sometimes BV will clear up without treatment. However, all women with symptoms of BV should be treated to avoid complications, especially if gynecology surgery is planned. Female partners generally do not need to be treated. However, BV may spread between female sex partners so treatment is helpful in preventing a recurrence of BV.   BV may be treated with antibiotics. The antibiotics come in either pill or vaginal cream forms. Either can be used with nonpregnant or pregnant women, but the recommended dosages differ. These antibiotics are not harmful to the baby.   BV can recur after treatment. If this happens, a second round of antibiotics will often be prescribed.   Treatment is important for pregnant women. If not treated, BV can cause a premature delivery, especially for a pregnant woman who had a premature birth in the past. All pregnant women who have symptoms of BV should be checked and treated.   For chronic reoccurrence of BV, treatment with a type of prescribed gel vaginally twice a week is helpful.  HOME CARE INSTRUCTIONS   Finish all  medication as directed by your caregiver.   Do not have sex until treatment is completed.   Tell your sexual partner that you have a vaginal infection. They should see their caregiver and be treated if they have problems, such as a mild rash or itching.   Practice safe sex. Use condoms. Only have 1 sex partner.  PREVENTION  Basic prevention steps can help reduce the risk of upsetting the natural balance of bacteria in the vagina and developing BV:  Do not have sexual intercourse (be abstinent).   Do not douche.   Use all of the medicine prescribed for treatment of BV, even if the signs and symptoms go away.   Tell your sex partner if you have BV. That way, they can be treated, if needed, to prevent reoccurrence.  SEEK MEDICAL CARE IF:   Your symptoms are not improving after 3 days of treatment.   You have increased discharge, pain, or fever.  MAKE SURE YOU:   Understand these instructions.   Will watch your condition.   Will get help right away if you are not doing well or get worse.  FOR MORE INFORMATION  Division of STD Prevention (DSTDP), Centers for Disease Control and Prevention: www.cdc.gov/std American [employer] Association (ASHA): www.ashastd.org  Document Released: 08/04/2005 Document Revised: 07/24/2011 Document Reviewed: 01/25/2009 Exit[employer] Patient Information 2012 [employer].. 

## 2012-01-13 NOTE — Telephone Encounter (Signed)
Pt is [redacted] wks gestation, called complaining of no fetal movement this am. Pt also stated that she was having pelvic and vaginal pain. No discharge or bleeding. Pt has only eaten a bag of chips and juice this am. Pt was advised by CNM to come in for NST and ov to follow. Mathis Bud

## 2012-01-13 NOTE — Progress Notes (Signed)
[redacted]w[redacted]d C/o dec FM - NST reactive BL=150, Cat 1 - no decels toco quiet Pelvic pain when moving, denies ctx, VB, LOF or d/c Wet prep - +clue GBS/GC/CT done today VE=cl/th/0 station, vertex low, cx slightly friable RX for flagyl Pt requests Note for work to work only 5 hours/day  Watch FH - S<D, but vtx is low and engaged, had growth [place] at [employer] 10 + leuk will send for cx rv'd FKC and labor sx's  RTO 1 wk

## 2012-01-13 NOTE — Progress Notes (Signed)
Pt c/o DEC FM since this AM  Increased vag & pelvic pain x 2days

## 2012-01-14 ENCOUNTER — Encounter: Payer: Medicaid Other | Admitting: Obstetrics and Gynecology

## 2012-01-15 LAB — URINE CULTURE
Colony Count: NO GROWTH
Organism ID, Bacteria: NO GROWTH

## 2012-01-15 LAB — STREP B DNA PROBE: GBSP: NEGATIVE

## 2012-01-20 ENCOUNTER — Encounter: Payer: Medicaid Other | Admitting: Obstetrics and Gynecology

## 2012-01-20 ENCOUNTER — Encounter: Payer: Self-pay | Admitting: Obstetrics and Gynecology

## 2012-01-20 ENCOUNTER — Ambulatory Visit (INDEPENDENT_AMBULATORY_CARE_PROVIDER_SITE_OTHER): Payer: Medicaid Other | Admitting: Obstetrics and Gynecology

## 2012-01-20 VITALS — BP 102/62 | Wt 157.0 lb

## 2012-01-20 DIAGNOSIS — Z331 Pregnant state, incidental: Secondary | ICD-10-CM

## 2012-01-20 NOTE — Progress Notes (Signed)
Pt wants cervix checked today 

## 2012-01-20 NOTE — Progress Notes (Signed)
A/P GBS negative Fetal kick counts reviewed Labor reviewed with pt All patients  questions answered 

## 2012-01-21 ENCOUNTER — Telehealth: Payer: Self-pay | Admitting: Obstetrics and Gynecology

## 2012-01-21 NOTE — Telephone Encounter (Signed)
Nancy/keshia/ob epic

## 2012-01-21 NOTE — Telephone Encounter (Signed)
PT CALLED, IS 37 WKS, STATES IS HAVING SOME CRAMPING AND PELVIC PAIN, IS NOT CONSTANT, HAS BH CTXS THIS IS PT'S 2ND BABY, DENIES BLDG/LOF, DOES HAVE +FM, NO MEDS TAKEN, PT SAYS SHE'S ON HER FEET AND WALKS AROUND A LOT @ WORK, PT ADVISED TO PUSH FLUIDS, TAKE TYLENOL AS DIRECTED, TAKE WARM BATH OR SHOWER, MONITOR FOR 5 OR MORE CTXS IN AN HOUR OR Q 3-5 MIN APART OR LOF, PT TO CALL BACK WITH ANY CONCERNS.

## 2012-01-27 ENCOUNTER — Telehealth (HOSPITAL_COMMUNITY): Payer: Self-pay | Admitting: Obstetrics and Gynecology

## 2012-01-27 NOTE — Telephone Encounter (Signed)
TC from patient--37 weeks, ran into house from rain, and since then, pubic bone and lower groin area have been very achy, "like she pulled a muscle".  No contractions, bleeding, discharge, or dysuria.  + FM.   Comfort measures reviewed (tylenol, heat, rest)--likely ligament pain/strain. Will CTO, call prn.

## 2012-01-28 ENCOUNTER — Encounter: Payer: Self-pay | Admitting: Obstetrics and Gynecology

## 2012-01-28 ENCOUNTER — Ambulatory Visit (INDEPENDENT_AMBULATORY_CARE_PROVIDER_SITE_OTHER): Payer: Medicaid Other | Admitting: Obstetrics and Gynecology

## 2012-01-28 VITALS — BP 104/64 | Wt 162.0 lb

## 2012-01-28 DIAGNOSIS — O0930 Supervision of pregnancy with insufficient antenatal care, unspecified trimester: Secondary | ICD-10-CM

## 2012-01-28 NOTE — Progress Notes (Signed)
Pt having pain and pressure in pelvis. Request VE Good fetal movement Cervix is 1-2/80%/-2 station She wishes to begin maternity leave tomorrow

## 2012-02-04 ENCOUNTER — Encounter: Payer: Self-pay | Admitting: Obstetrics and Gynecology

## 2012-02-04 ENCOUNTER — Ambulatory Visit (INDEPENDENT_AMBULATORY_CARE_PROVIDER_SITE_OTHER): Payer: Medicaid Other | Admitting: Obstetrics and Gynecology

## 2012-02-04 VITALS — BP 110/66 | Wt 161.0 lb

## 2012-02-04 DIAGNOSIS — Z331 Pregnant state, incidental: Secondary | ICD-10-CM

## 2012-02-04 DIAGNOSIS — Z3490 Encounter for supervision of normal pregnancy, unspecified, unspecified trimester: Secondary | ICD-10-CM

## 2012-02-04 NOTE — Progress Notes (Signed)
No complaints. Request VE

## 2012-02-04 NOTE — Progress Notes (Signed)
Normal pregnancy, experiencing some BH ctx, normal vaginal discharge, Large amount of stool per rectum and relief measures recommended.

## 2012-02-06 ENCOUNTER — Telehealth: Payer: Self-pay | Admitting: Obstetrics and Gynecology

## 2012-02-06 NOTE — Telephone Encounter (Signed)
Tc from pt per telephone call. Pt c/o contractions for the past hour. Pt unable to time contractions;however they are painful. No vaginal bldg. Positive fetal movement. Informed pt to time contractions for the next hour and call back with report. Pt voices understanding.

## 2012-02-06 NOTE — Telephone Encounter (Signed)
chandra/epic 

## 2012-02-11 ENCOUNTER — Telehealth: Payer: Self-pay | Admitting: Obstetrics and Gynecology

## 2012-02-11 NOTE — Telephone Encounter (Signed)
Pt is [redacted] wk gestation and called this am complaining of pressure in pelvic area when she voided  at 5 am. Pt didn't have any other symptoms and was advised that this is normal being that she is [redacted] wk gestation and the baby is causing pressure on her bladder. Pt was called back a few hrs later and she stated that he had less pelvic pressure when she voided. Jo Graham

## 2012-02-11 NOTE — Telephone Encounter (Signed)
Pam/40wks ob/epic

## 2012-02-13 ENCOUNTER — Ambulatory Visit (INDEPENDENT_AMBULATORY_CARE_PROVIDER_SITE_OTHER): Payer: Medicaid Other | Admitting: Obstetrics and Gynecology

## 2012-02-13 ENCOUNTER — Inpatient Hospital Stay (HOSPITAL_COMMUNITY)
Admission: AD | Admit: 2012-02-13 | Discharge: 2012-02-13 | Disposition: A | Discharge status: Home / Self Care | Payer: Medicaid Other | Source: Ambulatory Visit | Attending: Obstetrics and Gynecology | Admitting: Obstetrics and Gynecology

## 2012-02-13 ENCOUNTER — Encounter (HOSPITAL_COMMUNITY): Payer: Self-pay | Admitting: *Deleted

## 2012-02-13 ENCOUNTER — Telehealth: Payer: Self-pay | Admitting: Obstetrics and Gynecology

## 2012-02-13 ENCOUNTER — Encounter: Payer: Self-pay | Admitting: Obstetrics and Gynecology

## 2012-02-13 VITALS — BP 108/64 | Wt 159.0 lb

## 2012-02-13 DIAGNOSIS — R509 Fever, unspecified: Secondary | ICD-10-CM

## 2012-02-13 DIAGNOSIS — Z331 Pregnant state, incidental: Secondary | ICD-10-CM

## 2012-02-13 DIAGNOSIS — N898 Other specified noninflammatory disorders of vagina: Secondary | ICD-10-CM

## 2012-02-13 DIAGNOSIS — O99891 Other specified diseases and conditions complicating pregnancy: Secondary | ICD-10-CM | POA: Insufficient documentation

## 2012-02-13 DIAGNOSIS — N949 Unspecified condition associated with female genital organs and menstrual cycle: Secondary | ICD-10-CM | POA: Insufficient documentation

## 2012-02-13 LAB — CBC WITH DIFFERENTIAL/PLATELET
Basophils Absolute: 0 10*3/uL (ref 0.0–0.1)
Basophils Relative: 0 % (ref 0–1)
Eosinophils Relative: 1 % (ref 0–5)
HCT: 32.1 % — ABNORMAL LOW (ref 36.0–46.0)
Hemoglobin: 10.4 g/dL — ABNORMAL LOW (ref 12.0–15.0)
MCHC: 32.4 g/dL (ref 30.0–36.0)
MCV: 87 fL (ref 78.0–100.0)
Monocytes Absolute: 0.8 10*3/uL (ref 0.1–1.0)
Monocytes Relative: 8 % (ref 3–12)
Neutro Abs: 7.8 10*3/uL — ABNORMAL HIGH (ref 1.7–7.7)
RDW: 13.5 % (ref 11.5–15.5)

## 2012-02-13 LAB — URINALYSIS, ROUTINE W REFLEX MICROSCOPIC
Bilirubin Urine: NEGATIVE
Glucose, UA: NEGATIVE mg/dL
Ketones, ur: NEGATIVE mg/dL
Leukocytes, UA: NEGATIVE
Protein, ur: NEGATIVE mg/dL
pH: 7 (ref 5.0–8.0)

## 2012-02-13 LAB — URINE MICROSCOPIC-ADD ON

## 2012-02-13 MED ORDER — NITROFURANTOIN MONOHYD MACRO 100 MG PO CAPS: 100.0000 mg | ORAL_CAPSULE | Freq: Two times a day (BID) | ORAL | Status: DC

## 2012-02-13 MED ORDER — ACETAMINOPHEN 500 MG PO TABS
1000.0000 mg | ORAL_TABLET | Freq: Once | ORAL | Status: AC
  Administered 2012-02-13: 1000 mg via ORAL
  Filled 2012-02-13: qty 2

## 2012-02-13 NOTE — Discharge Instructions (Signed)
Fetal Movement Counts Patient Name: __________________________________________________ Patient Due Date: ____________________ Kick counts is highly recommended in high risk pregnancies, but it is a good idea for every pregnant woman to do. Start counting fetal movements at 28 weeks of the pregnancy. Fetal movements increase after eating a full meal or eating or drinking something sweet (the blood sugar is higher). It is also important to drink plenty of fluids (well hydrated) before doing the count. Lie on your left side because it helps with the circulation or you can sit in a comfortable chair with your arms over your belly (abdomen) with no distractions around you. DOING THE COUNT  Try to do the count the same time of day each time you do it.   Mark the day and time, then see how long it takes for you to feel 10 movements (kicks, flutters, swishes, rolls). You should have at least 10 movements within 2 hours. You will most likely feel 10 movements in much less than 2 hours. If you do not, wait an hour and count again. After a couple of days you will see a pattern.   What you are looking for is a change in the pattern or not enough counts in 2 hours. Is it taking longer in time to reach 10 movements?  SEEK MEDICAL CARE IF:  You feel less than 10 counts in 2 hours. Tried twice.   No movement in one hour.   The pattern is changing or taking longer each day to reach 10 counts in 2 hours.   You feel the baby is not moving as it usually does.  Date: ____________ Movements: ____________ Start time: ____________ Finish time: ____________  Date: ____________ Movements: ____________ Start time: ____________ Finish time: ____________ Date: ____________ Movements: ____________ Start time: ____________ Finish time: ____________ Date: ____________ Movements: ____________ Start time: ____________ Finish time: ____________ Date: ____________ Movements: ____________ Start time: ____________ Finish time:  ____________ Date: ____________ Movements: ____________ Start time: ____________ Finish time: ____________ Date: ____________ Movements: ____________ Start time: ____________ Finish time: ____________ Date: ____________ Movements: ____________ Start time: ____________ Finish time: ____________  Date: ____________ Movements: ____________ Start time: ____________ Finish time: ____________ Date: ____________ Movements: ____________ Start time: ____________ Finish time: ____________ Date: ____________ Movements: ____________ Start time: ____________ Finish time: ____________ Date: ____________ Movements: ____________ Start time: ____________ Finish time: ____________ Date: ____________ Movements: ____________ Start time: ____________ Finish time: ____________ Date: ____________ Movements: ____________ Start time: ____________ Finish time: ____________ Date: ____________ Movements: ____________ Start time: ____________ Finish time: ____________  Date: ____________ Movements: ____________ Start time: ____________ Finish time: ____________ Date: ____________ Movements: ____________ Start time: ____________ Finish time: ____________ Date: ____________ Movements: ____________ Start time: ____________ Finish time: ____________ Date: ____________ Movements: ____________ Start time: ____________ Finish time: ____________ Date: ____________ Movements: ____________ Start time: ____________ Finish time: ____________ Date: ____________ Movements: ____________ Start time: ____________ Finish time: ____________ Date: ____________ Movements: ____________ Start time: ____________ Finish time: ____________  Date: ____________ Movements: ____________ Start time: ____________ Finish time: ____________ Date: ____________ Movements: ____________ Start time: ____________ Finish time: ____________ Date: ____________ Movements: ____________ Start time: ____________ Finish time: ____________ Date: ____________ Movements:  ____________ Start time: ____________ Finish time: ____________ Date: ____________ Movements: ____________ Start time: ____________ Finish time: ____________ Date: ____________ Movements: ____________ Start time: ____________ Finish time: ____________ Date: ____________ Movements: ____________ Start time: ____________ Finish time: ____________  Date: ____________ Movements: ____________ Start time: ____________ Finish time: ____________ Date: ____________ Movements: ____________ Start time: ____________ Finish time: ____________ Date: ____________ Movements: ____________ Start time:   ____________ Doreatha Martin time: ____________ Date: ____________ Movements: ____________ Start time: ____________ Doreatha Martin time: ____________ Date: ____________ Movements: ____________ Start time: ____________ Doreatha Martin time: ____________ Date: ____________ Movements: ____________ Start time: ____________ Doreatha Martin time: ____________ Date: ____________ Movements: ____________ Start time: ____________ Doreatha Martin time: ____________  Date: ____________ Movements: ____________ Start time: ____________ Doreatha Martin time: ____________ Date: ____________ Movements: ____________ Start time: ____________ Doreatha Martin time: ____________ Date: ____________ Movements: ____________ Start time: ____________ Doreatha Martin time: ____________ Date: ____________ Movements: ____________ Start time: ____________ Doreatha Martin time: ____________ Date: ____________ Movements: ____________ Start time: ____________ Doreatha Martin time: ____________ Date: ____________ Movements: ____________ Start time: ____________ Doreatha Martin time: ____________ Date: ____________ Movements: ____________ Start time: ____________ Doreatha Martin time: ____________  Date: ____________ Movements: ____________ Start time: ____________ Doreatha Martin time: ____________ Date: ____________ Movements: ____________ Start time: ____________ Doreatha Martin time: ____________ Date: ____________ Movements: ____________ Start time: ____________ Doreatha Martin  time: ____________ Date: ____________ Movements: ____________ Start time: ____________ Doreatha Martin time: ____________ Date: ____________ Movements: ____________ Start time: ____________ Doreatha Martin time: ____________ Date: ____________ Movements: ____________ Start time: ____________ Doreatha Martin time: ____________ Date: ____________ Movements: ____________ Start time: ____________ Doreatha Martin time: ____________  Date: ____________ Movements: ____________ Start time: ____________ Doreatha Martin time: ____________ Date: ____________ Movements: ____________ Start time: ____________ Doreatha Martin time: ____________ Date: ____________ Movements: ____________ Start time: ____________ Doreatha Martin time: ____________ Date: ____________ Movements: ____________ Start time: ____________ Doreatha Martin time: ____________ Date: ____________ Movements: ____________ Start time: ____________ Doreatha Martin time: ____________ Date: ____________ Movements: ____________ Start time: ____________ Doreatha Martin time: ____________ Document Released: 09/03/2006 Document Revised: 07/24/2011 Document Reviewed: 03/06/2009 [employer] Patient Information 2012 [place].Normal Labor and Delivery Your caregiver must first be sure you are in labor. Signs of labor include:  You may pass what is called "the mucus plug" before labor begins. This is a small amount of blood stained mucus.   Regular uterine contractions.   The time between contractions get closer together.   The discomfort and pain gradually gets more intense.   Pains are mostly located in the back.   Pains get worse when walking.   The cervix (the opening of the uterus becomes thinner (begins to efface) and opens up (dilates).  Once you are in labor and admitted into the hospital or care center, your caregiver will do the following:  A complete physical examination.   Check your vital signs (blood pressure, pulse, temperature and the fetal heart rate).   Do a vaginal examination (using a sterile glove and  lubricant) to determine:   The position (presentation) of the baby (head [vertex] or buttock first).   The level (station) of the baby's head in the birth canal.   The effacement and dilatation of the cervix.   You may have your pubic hair shaved and be given an enema depending on your caregiver and the circumstance.   An electronic monitor is usually placed on your abdomen. The monitor follows the length and intensity of the contractions, as well as the baby's heart rate.   Usually, your caregiver will insert an IV in your arm with a bottle of sugar water. This is done as a precaution so that medications can be given to you quickly during labor or delivery.  NORMAL LABOR AND DELIVERY IS DIVIDED UP INTO 3 STAGES: First Stage This is when regular contractions begin and the cervix begins to efface and dilate. This stage can last from 3 to 15 hours. The end of the first stage is when the cervix is 100% effaced and 10 centimeters dilated. Pain medications may be given by   Injection (morphine, demerol,  etc.)   Regional anesthesia (spinal, caudal or epidural, anesthetics given in different locations of the spine). Paracervical pain medication may be given, which is an injection of and anesthetic on each side of the cervix.  A pregnant woman may request to have "Natural Childbirth" which is not to have any medications or anesthesia during her labor and delivery. Second Stage This is when the baby comes down through the birth canal (vagina) and is born. This can take 1 to 4 hours. As the baby's head comes down through the birth canal, you may feel like you are going to have a bowel movement. You will get the urge to bear down and push until the baby is delivered. As the baby's head is being delivered, the caregiver will decide if an episiotomy (a cut in the perineum and vagina area) is needed to prevent tearing of the tissue in this area. The episiotomy is sewn up after the delivery of the baby and  placenta. Sometimes a mask with nitrous oxide is given for the mother to breath during the delivery of the baby to help if there is too much pain. The end of Stage 2 is when the baby is fully delivered. Then when the umbilical cord stops pulsating it is clamped and cut. Third Stage The third stage begins after the baby is completely delivered and ends after the placenta (afterbirth) is delivered. This usually takes 5 to 30 minutes. After the placenta is delivered, a medication is given either by intravenous or injection to help contract the uterus and prevent bleeding. The third stage is not painful and pain medication is usually not necessary. If an episiotomy was done, it is repaired at this time. After the delivery, the mother is watched and monitored closely for 1 to 2 hours to make sure there is no postpartum bleeding (hemorrhage). If there is a lot of bleeding, medication is given to contract the uterus and stop the bleeding. Document Released: 05/13/2008 Document Revised: 07/24/2011 Document Reviewed: 05/13/2008 [facility] Patient Information 2012 [place], [place].Urinary Tract Infection in Pregnancy A urinary tract infection (UTI) is a bacterial infection of the urinary tract. Infection of the urinary tract can include the ureters, kidneys (pyelonephritis), bladder (cystitis), and urethra (urethritis). All pregnant women should be screened for bacteria in the urinary tract. Identifying and treating a UTI will decrease the risk of preterm labor and developing more serious infections in both the mother and baby. CAUSES Bacteria germs cause almost all UTIs. There are many factors that can increase your chances of getting a UTI during pregnancy. These include:  Having a short urethra.   Poor toilet and hygiene habits.   Sexual intercourse.   Blockage of urine along the urinary tract.   Problems with the pelvic muscles or nerves.   Diabetes.   Obesity.   Bladder problems after having several  children.   Previous history of UTI.  SYMPTOMS   Pain, burning, or a stinging feeling when urinating.   Suddenly feeling the need to urinate right away (urgency).   Loss of bladder control (urinary incontinence).   Frequent urination, more than is common with pregnancy.   Lower abdominal or back discomfort.   Bad smelling urine.   Cloudy urine.   Blood in the urine (hematuria).   Fever.  When the kidneys are infected, the symptoms may be:  Back pain.   Flank pain on the right side more so than the left.   Fever.   Chills.   Nausea.  Vomiting.  DIAGNOSIS   Urine tests.   Additional tests and procedures may include:   Ultrasound of the kidneys, ureters, bladder, and urethra.   Looking in the bladder with a lighted tube (cystoscopy).   Certain X-ray studies only when absolutely necessary.  Finding out the results of your test Ask when your test results will be ready. Make sure you get your test results. TREATMENT  Antibiotic medicine by mouth.   Antibiotics given through the vein (intravenously), if needed.  HOME CARE INSTRUCTIONS   Take your antibiotics as directed. Finish them even if you start to feel better. Only take medicine as directed by your caregiver.   Drink enough fluids to keep your urine clear or pale yellow.   Do not have sexual intercourse until the infection is gone and your caregiver says it is okay.   Make sure you are tested for UTIs throughout your pregnancy if you get one. These infections often come back.  Preventing a UTI in the future:  Practice good toilet habits. Always wipe from front to back. Use the tissue only once.   Do not hold your urine. Empty your bladder as soon as possible when the urge comes.   Do not douche or use deodorant sprays.   Wash with soap and warm water around the genital area and the anus.   Empty your bladder before and after sexual intercourse.   Wear underwear with a cotton crotch.    Avoid caffeine and carbonated drinks. They can irritate the bladder.   Drink cranberry juice or take cranberry pills. This may decrease the risk of getting a UTI.   Do not drink alcohol.   Keep all your appointments and tests as scheduled.  SEEK MEDICAL CARE IF:   Your symptoms get worse.   You are still having fevers 2 or more days after treatment begins.   You develop a rash.   You feel that you are having problems with medicines prescribed.   You develop abnormal vaginal discharge.  SEEK IMMEDIATE MEDICAL CARE IF:   You develop back or flank pain.   You develop chills.   You have blood in your urine.   You develop nausea and vomiting.   You develop contractions of your uterus.   You have a gush of fluid from the vagina.  MAKE SURE YOU:   Understand these instructions.   Will watch your condition.   Will get help right away if you are not doing well or get worse.  Document Released: 11/29/2010 Document Revised: 07/24/2011 Document Reviewed: 11/29/2010 [facility] Patient Information 2012 [place], [place].

## 2012-02-13 NOTE — Progress Notes (Signed)
C/o lots of pelvic pressure  

## 2012-02-13 NOTE — Telephone Encounter (Signed)
Induction scheduled for 02/16/12 @ 7:00am with SR/mk -Adrianne Pridgen

## 2012-02-13 NOTE — MAU Provider Note (Signed)
History     CSN: [id number]  Arrival date and time: 02/13/12 2035   None     Chief Complaint  Patient presents with  . Vaginal Discharge   HPI Comments: Pt is a G2P0101 at [redacted]w[redacted]d w c/o brown d/c. Pt arrived unannounced, denies VB, LOF, GFM. Denies any cough, aches, chills, dysuria.  Pregnancy significant for: 1. Late to care 2. rcv'd 17p for hx of 36wk PTD 3. Needs colpo and bx PP, hx CIN1 4. Hx 2nd trim BV  5. GBS neg  Vaginal Discharge The patient's primary symptoms include a vaginal discharge.     Past Surgical History  Procedure Date  . No past surgeries     Family History  Problem Relation Age of Onset  . Asthma Maternal Aunt   . Cancer Maternal Aunt     History  Substance Use Topics  . Smoking status: Never Smoker   . Smokeless tobacco: Never Used  . Alcohol Use: No    Allergies: No Known Allergies  Prescriptions prior to admission  Medication Sig Dispense Refill  . Prenatal Vit-Fe Fumarate-FA (PRENATAL MULTIVITAMIN) TABS Take 1 tablet by mouth daily.        Review of Systems  Genitourinary: Positive for vaginal discharge.  All other systems reviewed and are negative.   Physical Exam   Blood pressure 118/70, pulse 100, temperature 100 F (37.8 C), temperature source Oral, resp. rate 18, last menstrual period 05/05/2011.  Physical Exam  Nursing note and vitals reviewed. Constitutional: She is oriented to person, place, and time. She appears well-developed and well-nourished. No distress.  HENT:  Head: Normocephalic.  Neck: Normal range of motion.  Cardiovascular: Normal rate, regular rhythm and normal heart sounds.        Slightly tachycardic   Respiratory: Effort normal and breath sounds normal.  GI: Soft. Bowel sounds are normal.  Genitourinary: Vaginal discharge found.       Some brown d/c on glove cx posterior 2+cm/50/-2, vtx engaged  Musculoskeletal: Normal range of motion. She exhibits no edema.  Neurological: She is alert and  oriented to person, place, and time.  Skin: Skin is warm and dry.  Psychiatric: She has a normal mood and affect. Her behavior is normal.   FHR 150 reactive, cat 1 toco rare ctx   MAU Course  Procedures    Assessment and Plan  IUP at [redacted]w[redacted]d Temp=100.0 FHR reassuring No sx's labor  Will check UA and CBC Give 1000mg  tylenol  And PO hydrate   Ziza Hastings M 02/13/2012, 9:20 PM   Addendum:  Results for orders placed during the hospital encounter of 02/13/12 (from the past 24 hour(s))  URINALYSIS, ROUTINE W REFLEX MICROSCOPIC     Status: Abnormal   Collection Time   02/13/12  8:45 PM      Component Value Range   Color, Urine YELLOW  YELLOW   APPearance CLEAR  CLEAR   Specific Gravity, Urine 1.015  1.005 - 1.030   pH 7.0  5.0 - 8.0   Glucose, UA NEGATIVE  NEGATIVE mg/dL   Hgb urine dipstick MODERATE (*) NEGATIVE   Bilirubin Urine NEGATIVE  NEGATIVE   Ketones, ur NEGATIVE  NEGATIVE mg/dL   Protein, ur NEGATIVE  NEGATIVE mg/dL   Urobilinogen, UA 2.0 (*) 0.0 - 1.0 mg/dL   Nitrite NEGATIVE  NEGATIVE   Leukocytes, UA NEGATIVE  NEGATIVE  URINE MICROSCOPIC-ADD ON     Status: Abnormal   Collection Time   02/13/12  8:45 PM  Component Value Range   Squamous Epithelial / LPF FEW (*) RARE   WBC, UA 0-2  <3 WBC/hpf   RBC / HPF 3-6  <3 RBC/hpf   Bacteria, UA FEW (*) RARE  CBC WITH DIFFERENTIAL     Status: Abnormal   Collection Time   02/13/12  9:38 PM      Component Value Range   WBC 10.7 (*) 4.0 - 10.5 K/uL   RBC 3.69 (*) 3.87 - 5.11 MIL/uL   Hemoglobin 10.4 (*) 12.0 - 15.0 g/dL   HCT 16.1 (*) 09.6 - 04.5 %   MCV 87.0  78.0 - 100.0 fL   MCH 28.2  26.0 - 34.0 pg   MCHC 32.4  30.0 - 36.0 g/dL   RDW 40.9  81.1 - 91.4 %   Platelets 222  150 - 400 K/uL   Neutrophils Relative 73  43 - 77 %   Neutro Abs 7.8 (*) 1.7 - 7.7 K/uL   Lymphocytes Relative 18  12 - 46 %   Lymphs Abs 1.9  0.7 - 4.0 K/uL   Monocytes Relative 8  3 - 12 %   Monocytes Absolute 0.8  0.1 - 1.0 K/uL    Eosinophils Relative 1  0 - 5 %   Eosinophils Absolute 0.1  0.0 - 0.7 K/uL   Basophils Relative 0  0 - 1 %   Basophils Absolute 0.0  0.0 - 0.1 K/uL   Tempt 98.2 after tylenol FHR remains reassuring Pt is sched for IOL on 7-1 in the AM  DC'd home w Rx for macrobid Will send UA for cx rv'd labor sx's and FKC, pt to call if temp >100.4 at home or worsening sx's

## 2012-02-13 NOTE — MAU Note (Signed)
Pt states brown  Discharge starting today. She was evaluated in the physicians office today and was 3-4 cms per physician. Denies leakage of fluid. States mild contractions.

## 2012-02-13 NOTE — Progress Notes (Signed)
Increased contractions. Increased discharge. No bleeding. No LOF. Pt requesting IOL. Look at first available next week.   Pt desires IOL: aware of R&B

## 2012-02-15 ENCOUNTER — Telehealth: Payer: Self-pay | Admitting: Obstetrics and Gynecology

## 2012-02-15 ENCOUNTER — Inpatient Hospital Stay (HOSPITAL_COMMUNITY): Payer: Medicaid Other | Admitting: Anesthesiology

## 2012-02-15 ENCOUNTER — Inpatient Hospital Stay (HOSPITAL_COMMUNITY)
Admission: RE | Admit: 2012-02-15 | Discharge: 2012-02-17 | DRG: 775 | Disposition: A | Discharge status: Home / Self Care | Payer: Medicaid Other | Source: Ambulatory Visit | Attending: Obstetrics and Gynecology | Admitting: Obstetrics and Gynecology

## 2012-02-15 ENCOUNTER — Encounter (HOSPITAL_COMMUNITY): Payer: Self-pay | Admitting: Anesthesiology

## 2012-02-15 ENCOUNTER — Encounter (HOSPITAL_COMMUNITY): Payer: Self-pay

## 2012-02-15 DIAGNOSIS — D649 Anemia, unspecified: Secondary | ICD-10-CM

## 2012-02-15 DIAGNOSIS — IMO0001 Reserved for inherently not codable concepts without codable children: Secondary | ICD-10-CM

## 2012-02-15 DIAGNOSIS — Z3687 Encounter for antenatal screening for uncertain dates: Secondary | ICD-10-CM

## 2012-02-15 DIAGNOSIS — K649 Unspecified hemorrhoids: Secondary | ICD-10-CM

## 2012-02-15 LAB — CBC
Hemoglobin: 11.1 g/dL — ABNORMAL LOW (ref 12.0–15.0)
MCH: 28.2 pg (ref 26.0–34.0)
MCHC: 32.6 g/dL (ref 30.0–36.0)
RDW: 13.6 % (ref 11.5–15.5)

## 2012-02-15 MED ORDER — ACETAMINOPHEN 325 MG PO TABS: 650.0000 mg | ORAL_TABLET | ORAL | Status: DC | PRN

## 2012-02-15 MED ORDER — ONDANSETRON HCL 4 MG PO TABS: 4.0000 mg | ORAL_TABLET | ORAL | Status: DC | PRN

## 2012-02-15 MED ORDER — OXYTOCIN BOLUS FROM INFUSION
250.0000 mL | Freq: Once | INTRAVENOUS | Status: AC
  Administered 2012-02-15: 250 mL via INTRAVENOUS
  Filled 2012-02-15: qty 500

## 2012-02-15 MED ORDER — DIBUCAINE 1 % RE OINT
1.0000 "application " | TOPICAL_OINTMENT | RECTAL | Status: DC | PRN
  Administered 2012-02-16: 1 via RECTAL
  Filled 2012-02-15: qty 28

## 2012-02-15 MED ORDER — OXYCODONE-ACETAMINOPHEN 5-325 MG PO TABS: 1.0000 | ORAL_TABLET | ORAL | Status: DC | PRN

## 2012-02-15 MED ORDER — NITROFURANTOIN MONOHYD MACRO 100 MG PO CAPS
100.0000 mg | ORAL_CAPSULE | Freq: Two times a day (BID) | ORAL | Status: DC
  Administered 2012-02-16 – 2012-02-17 (×3): 100 mg via ORAL
  Filled 2012-02-15 (×4): qty 1

## 2012-02-15 MED ORDER — LACTATED RINGERS IV SOLN: 500.0000 mL | INTRAVENOUS | Status: DC | PRN

## 2012-02-15 MED ORDER — ONDANSETRON HCL 4 MG/2ML IJ SOLN: 4.0000 mg | Freq: Four times a day (QID) | INTRAMUSCULAR | Status: DC | PRN

## 2012-02-15 MED ORDER — TETANUS-DIPHTH-ACELL PERTUSSIS 5-2.5-18.5 LF-MCG/0.5 IM SUSP
0.5000 mL | Freq: Once | INTRAMUSCULAR | Status: AC
  Administered 2012-02-16: 0.5 mL via INTRAMUSCULAR

## 2012-02-15 MED ORDER — FLEET ENEMA 7-19 GM/118ML RE ENEM: 1.0000 | ENEMA | RECTAL | Status: DC | PRN

## 2012-02-15 MED ORDER — LIDOCAINE HCL (PF) 1 % IJ SOLN
30.0000 mL | INTRAMUSCULAR | Status: DC | PRN
  Administered 2012-02-15: 30 mL via SUBCUTANEOUS
  Filled 2012-02-15: qty 30

## 2012-02-15 MED ORDER — ZOLPIDEM TARTRATE 5 MG PO TABS: 5.0000 mg | ORAL_TABLET | Freq: Every evening | ORAL | Status: DC | PRN

## 2012-02-15 MED ORDER — FENTANYL CITRATE 0.05 MG/ML IJ SOLN: 100.0000 ug | INTRAMUSCULAR | Status: DC | PRN

## 2012-02-15 MED ORDER — PHENYLEPHRINE 40 MCG/ML (10ML) SYRINGE FOR IV PUSH (FOR BLOOD PRESSURE SUPPORT)
80.0000 ug | PREFILLED_SYRINGE | INTRAVENOUS | Status: DC | PRN
  Filled 2012-02-15: qty 5

## 2012-02-15 MED ORDER — FENTANYL 2.5 MCG/ML BUPIVACAINE 1/10 % EPIDURAL INFUSION (WH - ANES)
14.0000 mL/h | INTRAMUSCULAR | Status: DC
  Filled 2012-02-15: qty 60

## 2012-02-15 MED ORDER — CITRIC ACID-SODIUM CITRATE 334-500 MG/5ML PO SOLN: 30.0000 mL | ORAL | Status: DC | PRN

## 2012-02-15 MED ORDER — BUTORPHANOL TARTRATE 2 MG/ML IJ SOLN: 1.0000 mg | INTRAMUSCULAR | Status: DC | PRN

## 2012-02-15 MED ORDER — OXYTOCIN 40 UNITS IN LACTATED RINGERS INFUSION - SIMPLE MED
62.5000 mL/h | Freq: Once | INTRAVENOUS | Status: AC
  Administered 2012-02-15: 62.5 mL/h via INTRAVENOUS
  Filled 2012-02-15: qty 1000

## 2012-02-15 MED ORDER — LIDOCAINE HCL (PF) 1 % IJ SOLN
INTRAMUSCULAR | Status: DC | PRN
  Administered 2012-02-15 (×2): 4 mL

## 2012-02-15 MED ORDER — DIPHENHYDRAMINE HCL 25 MG PO CAPS: 25.0000 mg | ORAL_CAPSULE | Freq: Four times a day (QID) | ORAL | Status: DC | PRN

## 2012-02-15 MED ORDER — LACTATED RINGERS IV SOLN
INTRAVENOUS | Status: DC
  Administered 2012-02-15: 15:00:00 via INTRAVENOUS
  Administered 2012-02-15: 125 mL/h via INTRAVENOUS

## 2012-02-15 MED ORDER — LANOLIN HYDROUS EX OINT: TOPICAL_OINTMENT | CUTANEOUS | Status: DC | PRN

## 2012-02-15 MED ORDER — WITCH HAZEL-GLYCERIN EX PADS
1.0000 "application " | MEDICATED_PAD | CUTANEOUS | Status: DC | PRN
  Administered 2012-02-16: 1 via TOPICAL

## 2012-02-15 MED ORDER — SIMETHICONE 80 MG PO CHEW: 80.0000 mg | CHEWABLE_TABLET | ORAL | Status: DC | PRN

## 2012-02-15 MED ORDER — FENTANYL 2.5 MCG/ML BUPIVACAINE 1/10 % EPIDURAL INFUSION (WH - ANES)
INTRAMUSCULAR | Status: DC | PRN
  Administered 2012-02-15: 14 mL/h via EPIDURAL

## 2012-02-15 MED ORDER — EPHEDRINE 5 MG/ML INJ
10.0000 mg | INTRAVENOUS | Status: DC | PRN
  Filled 2012-02-15: qty 4

## 2012-02-15 MED ORDER — SENNOSIDES-DOCUSATE SODIUM 8.6-50 MG PO TABS
2.0000 | ORAL_TABLET | Freq: Every day | ORAL | Status: DC
  Administered 2012-02-15 – 2012-02-16 (×2): 2 via ORAL

## 2012-02-15 MED ORDER — LACTATED RINGERS IV SOLN
500.0000 mL | Freq: Once | INTRAVENOUS | Status: AC
  Administered 2012-02-15: 500 mL via INTRAVENOUS

## 2012-02-15 MED ORDER — IBUPROFEN 600 MG PO TABS
600.0000 mg | ORAL_TABLET | Freq: Four times a day (QID) | ORAL | Status: DC
  Administered 2012-02-16 – 2012-02-17 (×7): 600 mg via ORAL
  Filled 2012-02-15 (×7): qty 1

## 2012-02-15 MED ORDER — PHENYLEPHRINE 40 MCG/ML (10ML) SYRINGE FOR IV PUSH (FOR BLOOD PRESSURE SUPPORT): 80.0000 ug | PREFILLED_SYRINGE | INTRAVENOUS | Status: DC | PRN

## 2012-02-15 MED ORDER — IBUPROFEN 600 MG PO TABS: 600.0000 mg | ORAL_TABLET | Freq: Four times a day (QID) | ORAL | Status: DC | PRN

## 2012-02-15 MED ORDER — ONDANSETRON HCL 4 MG/2ML IJ SOLN: 4.0000 mg | INTRAMUSCULAR | Status: DC | PRN

## 2012-02-15 MED ORDER — DIPHENHYDRAMINE HCL 50 MG/ML IJ SOLN: 12.5000 mg | INTRAMUSCULAR | Status: DC | PRN

## 2012-02-15 MED ORDER — PRENATAL MULTIVITAMIN CH
1.0000 | ORAL_TABLET | Freq: Every day | ORAL | Status: DC
  Administered 2012-02-16 – 2012-02-17 (×2): 1 via ORAL
  Filled 2012-02-15 (×2): qty 1

## 2012-02-15 MED ORDER — BENZOCAINE-MENTHOL 20-0.5 % EX AERO
1.0000 "application " | INHALATION_SPRAY | CUTANEOUS | Status: DC | PRN
  Administered 2012-02-16: 1 via TOPICAL
  Filled 2012-02-15: qty 56

## 2012-02-15 NOTE — Anesthesia Preprocedure Evaluation (Signed)

## 2012-02-15 NOTE — Progress Notes (Signed)
  Subjective: Comfortable with epidural.  No sensation of pressure.  Objective: BP 142/48  Pulse 134  Temp 97.5 F (36.4 C) (Oral)  Resp 20  Ht 5\' 2"  (1.575 m)  Wt 159 lb (72.122 kg)  BMI 29.08 kg/m2  SpO2 99%  LMP 05/05/2011      FHT: Category 1 UC:   regular, every 3 minutes SVE:  Complete, BBOW, vtx 0 station. AROM--clear fluid.   Assessment / Plan: Spontaneous labor, appropriate progress Will await increased pressure.   Jo Graham 02/15/2012, 5:42 PM

## 2012-02-15 NOTE — H&P (Signed)
Jo Graham is a 26 y.o. female, G2P0101 at 80 6/7 weeks, presenting for contractions since this am.  Denies leaking or bleeding, reports +FM.  Cervix has been 3 cm on last exam in office last week.    Patient Active Problem List  Diagnosis  . Bacterial vaginosis  . History of preterm labor  . Pregnancy as incidental finding  . 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 Late prenatal care  . Hemorrhoids  . Unsure of LMP (last menstrual period)   . Active labor  Needs pap and colpo pp.   History of present pregnancy: Patient entered care at 19 weeks.  EDC of 02/09/12 was established by LMP and in agreement with [place] at 19-20 weeks.  Anatomy scan was done at 21 weeks, with normal findings and an anterior placenta.  Further ultrasounds were done at 32 weeks, with normal growth and fluid.  She had CIN on pap at NOB, with colposcopy during pregnancy, and plan made for colpo and biopsy pp.  She received 17P during her pregnancy due to history of 36 week delivery.  She had a negative FFN at 29 weeks. At her last evalution, she was 3 cm.  OB History    Grav Para Term Preterm Abortions TAB SAB Ect Mult Living   2 1  1      1     #1--2009 SVB. SROM at 6 weeks.  Female. 5lbs approx, 8 hour labor, epidural. #2--Current.. This is with another partner  Past Medical History  Diagnosis Date  . H/O candidiasis   . H/O cystitis     x1   Past Surgical History  Procedure Date  . No past surgeries    Family History: family history includes Asthma in her maternal aunt and Cancer in her maternal aunt.  Social History:  reports that she has never smoked. She has never used smokeless tobacco. She reports that she does not drink alcohol or use illicit drugs.  ROS:  Contractions, + FM.  No bleeding, leaking, or dysuria.  Dilation: 7 Effacement (%): 90 Station: 0 Exam by:: vickie Genaro Bekker Blood pressure 138/83, pulse 120, temperature 97.5 F (36.4 C), temperature  source Oral, resp. rate 20, height 5\' 2"  (1.575 m), weight 159 lb (72.122 kg), last menstrual period 05/05/2011.  Chest clear Heart RRR without murmur Abd gravid, NT, FH 38 cm Pelvic: as above, vtx very low in pelvis Ext: WNL  FHR: Reactive UCs:  q 4 min  Prenatal labs: ABO, Rh:  O=+ Antibody:  Neg Rubella:  immune RPR: Nonreactive (04/01 0000)  HBsAg:   Neg HIV:   Neg GBS: NEGATIVE (05/28 1451) Sickle cell/Hgb electrophoresis:  Neg Pap:  CIN 1 at NOB, plan for colpo and bx pp GC:  Negative at NOB and 3rd trimester Chlamydia:  Negative at NOB and 3rd trimester Genetic screenings:  Declined Glucola:  WNL Hgb 11.5 at NOB, stable at 28 weeks.   Assessment/Plan: IUP at 40 6/7 weeks Active labor Negative GBS   Plan: Admit to Birthing Suite per consult with Dr. Normand Sloop Routine [facility] orders Declines pain medication at this time.  Arlyn Buerkle, [place], [place] 02/15/2012, 2:51 PM

## 2012-02-15 NOTE — Telephone Encounter (Signed)
TC from patient--G2P1, 40 6/7 weeks, UCs q 4 min.  Scheduled for induction tomorrow.  Cervix has been 3 cm on last exam Denies leaking or bleeding, reports +FM.  Come to MAU.

## 2012-02-15 NOTE — Anesthesia Procedure Notes (Signed)
Epidural Patient location during procedure: OB Start time: 02/15/2012 3:36 PM  Staffing Anesthesiologist: Hartleigh Edmonston A. Performed by: anesthesiologist   Preanesthetic Checklist Completed: patient identified, site marked, surgical consent, pre-op evaluation, timeout performed, IV checked, risks and benefits discussed and monitors and equipment checked  Epidural Patient position: sitting Prep: site prepped and draped and DuraPrep Patient monitoring: continuous pulse ox and blood pressure Approach: midline Injection technique: LOR air  Needle:  Needle type: Tuohy  Needle gauge: 17 G Needle length: 9 cm Needle insertion depth: 4 cm Catheter type: closed end flexible Catheter size: 19 Gauge Catheter at skin depth: 9 cm Test dose: negative and Other  Assessment Events: blood not aspirated, injection not painful, no injection resistance, negative IV test and no paresthesia  Additional Notes Patient identified. Risks and benefits discussed including failed block, incomplete  Pain control, post dural puncture headache, nerve damage, paralysis, blood pressure Changes, nausea, vomiting, reactions to medications-both toxic and allergic and post Partum back pain. All questions were answered. Patient expressed understanding and wished to proceed. Sterile technique was used throughout procedure. Epidural site was Dressed with sterile barrier dressing. No paresthesias, signs of intravascular injection Or signs of intrathecal spread were encountered.  Patient was more comfortable after the epidural was dosed. Please see RN's note for documentation of vital signs and FHR which are stable.

## 2012-02-16 LAB — CBC
MCV: 85.8 fL (ref 78.0–100.0)
Platelets: 220 10*3/uL (ref 150–400)
RBC: 3.31 MIL/uL — ABNORMAL LOW (ref 3.87–5.11)
RDW: 13.6 % (ref 11.5–15.5)
WBC: 14.2 10*3/uL — ABNORMAL HIGH (ref 4.0–10.5)

## 2012-02-16 LAB — RPR: RPR Ser Ql: NONREACTIVE

## 2012-02-16 NOTE — Progress Notes (Signed)
S: comfortable, little bleeding, slept little    bottle feeding O VSS     abd soft, nt, ff      sm  Flow 1 degree perineal lac well approximated no redness, edema, perineum clean intact     -Homans sign bilaterally,      trace edema A normal involution     Lactating     PP day 1 P continue care Lavera Guise, CNM

## 2012-02-16 NOTE — Progress Notes (Signed)
UR chart review completed.  

## 2012-02-17 DIAGNOSIS — D649 Anemia, unspecified: Secondary | ICD-10-CM

## 2012-02-17 MED ORDER — SENNOSIDES-DOCUSATE SODIUM 8.6-50 MG PO TABS: 2.0000 | ORAL_TABLET | Freq: Every day | ORAL | Status: AC

## 2012-02-17 MED ORDER — IBUPROFEN 600 MG PO TABS: 600.0000 mg | ORAL_TABLET | Freq: Four times a day (QID) | ORAL | Status: AC | PRN

## 2012-02-17 MED ORDER — FERROUS SULFATE 325 (65 FE) MG PO TABS: 325.0000 mg | ORAL_TABLET | Freq: Two times a day (BID) | ORAL | Status: DC

## 2012-02-17 NOTE — Discharge Instructions (Signed)
Anemia, Frequently Asked Questions WHAT ARE THE SYMPTOMS OF ANEMIA?  Headache.   Difficulty thinking.   Fatigue.   Shortness of breath.   Weakness.   Rapid heartbeat.  AT WHAT POINT ARE PEOPLE CONSIDERED ANEMIC?  This varies with gender and age.   Both hemoglobin (Hgb) and hematocrit values are used to define anemia. These lab values are obtained from a complete blood count (CBC) test. This is performed at a caregiver's office.   The normal range of hemoglobin values for adult men is 14.0 g/dL to 17.4 g/dL. For nonpregnant women, values are 12.3 g/dL to 15.3 g/dL.   The World Health Organization defines anemia as less than 12 g/dL for nonpregnant women and less than 13 g/dL for men.   For adult males, the average normal hematocrit is 46%, and the range is 40% to 52%.   For adult females, the average normal hematocrit is 41%, and the range is 35% to 47%.   Values that fall below the lower limits can be a sign of anemia and should have further checking (evaluation).  GROUPS OF PEOPLE WHO ARE AT RISK FOR DEVELOPING ANEMIA INCLUDE:   Infants who are breastfed or taking a formula that is not fortified with iron.   Children going through a rapid growth spurt. The iron available can not keep up with the needs for a red cell mass which must grow with the child.   Women in childbearing years. They need iron because of blood loss during menstruation.   Pregnant women. The growing fetus creates a high demand for iron.   People with ongoing gastrointestinal blood loss are at risk of developing iron deficiency.   Individuals with leukemia or cancer who must receive chemotherapy or radiation to treat their disease. The drugs or radiation used to treat these diseases often decreases the bone marrow's ability to make cells of all classes. This includes red blood cells, white blood cells, and platelets.   Individuals with chronic inflammatory conditions such as rheumatoid arthritis or  chronic infections.   The elderly.  ARE SOME TYPES OF ANEMIA INHERITED?   Yes, some types of anemia are due to inherited or genetic defects.   Sickle cell anemia. This occurs most often in people of African, African American, and Mediterranean descent.   Thalassemia (or Cooley's anemia). This type is found in people of Mediterranean and Southeast Asian descent. These types of anemia are common.   [place]. This is rare.  CAN CERTAIN MEDICATIONS CAUSE A PERSON TO BECOME ANEMIC?  Yes. For example, drugs to fight cancer (chemotherapeutic agents) often cause anemia. These drugs can slow the bone marrow's ability to make red blood cells. If there are not enough red blood cells, the body does not get enough oxygen. WHAT HEMATOCRIT LEVEL IS REQUIRED TO DONATE BLOOD?  The lower limit of an acceptable hematocrit for blood donors is 38%. If you have a low hematocrit value, you should schedule an appointment with your caregiver. ARE BLOOD TRANSFUSIONS COMMONLY USED TO CORRECT ANEMIA, AND ARE THEY DANGEROUS?  They are used to treat anemia as a last resort. Your caregiver will find the cause of the anemia and correct it if possible. Most blood transfusions are given because of excessive bleeding at the time of surgery, with trauma, or because of bone marrow suppression in patients with cancer or leukemia on chemotherapy. Blood transfusions are safer than ever before. We also know that blood transfusions affect the immune system and may increase certain risks. There is   also a concern for human error. In 1/16,000 transfusions, a patient receives a transfusion of blood that is not matched with his or her blood type.  WHAT IS IRON DEFICIENCY ANEMIA AND CAN I CORRECT IT BY CHANGING MY DIET?  Iron is an essential part of hemoglobin. Without enough hemoglobin, anemia develops and the body does not get the right amount of oxygen. Iron deficiency anemia develops after the body has had a low level of iron for a long  time. This is either caused by blood loss, not taking in or absorbing enough iron, or increased demands for iron (like pregnancy or rapid growth).  Foods from animal origin such as beef, chicken, and pork, are good sources of iron. Be sure to have one of these foods at each meal. Vitamin C helps your body absorb iron. Foods rich in Vitamin C include citrus, bell pepper, strawberries, spinach and cantaloupe. In some cases, iron supplements may be needed in order to correct the iron deficiency. In the case of poor absorption, extra iron may have to be given directly into the vein through a needle (intravenously). I HAVE BEEN DIAGNOSED WITH IRON DEFICIENCY ANEMIA AND MY CAREGIVER PRESCRIBED IRON SUPPLEMENTS. HOW LONG WILL IT TAKE FOR MY BLOOD TO BECOME NORMAL?  It depends on the degree of anemia at the beginning of treatment. Most people with mild to moderate iron deficiency, anemia will correct the anemia over a period of 2 to 3 months. But after the anemia is corrected, the iron stored by the body is still low. Caregivers often suggest an additional 6 months of oral iron therapy once the anemia has been reversed. This will help prevent the iron deficiency anemia from quickly happening again. Non-anemic adult males should take iron supplements only under the direction of a doctor, too much iron can cause liver damage.  MY HEMOGLOBIN IS 9 G/DL AND I AM SCHEDULED FOR SURGERY. SHOULD I POSTPONE THE SURGERY?  If you have Hgb of 9, you should discuss this with your caregiver right away. Many patients with similar hemoglobin levels have had surgery without problems. If minimal blood loss is expected for a minor procedure, no treatment may be necessary.  If a greater blood loss is expected for more extensive procedures, you should ask your caregiver about being treated with erythropoietin and iron. This is to accelerate the recovery of your hemoglobin to a normal level before surgery. An anemic patient who undergoes  high-blood-loss surgery has a greater risk of surgical complications and need for a blood transfusion, which also carries some risk.  I HAVE BEEN TOLD THAT HEAVY MENSTRUAL PERIODS CAUSE ANEMIA. IS THERE ANYTHING I CAN DO TO PREVENT THE ANEMIA?  Anemia that results from heavy periods is usually due to iron deficiency. You can try to meet the increased demands for iron caused by the heavy monthly blood loss by increasing the intake of iron-rich foods. Iron supplements may be required. Discuss your concerns with your caregiver. WHAT CAUSES ANEMIA DURING PREGNANCY?  Pregnancy places major demands on the body. The mother must meet the needs of both her body and her growing baby. The body needs enough iron and folate to make the right amount of red blood cells. To prevent anemia while pregnant, the mother should stay in close contact with her caregiver.  Be sure to eat a diet that has foods rich in iron and folate like liver and dark green leafy vegetables. Folate plays an important role in the normal development of a baby's   spinal cord. Folate can help prevent serious disorders like spina bifida. If your diet does not provide adequate nutrients, you may want to talk with your caregiver about nutritional supplements.  WHAT IS THE RELATIONSHIP BETWEEN FIBROID TUMORS AND ANEMIA IN WOMEN?  The relationship is usually caused by the increased menstrual blood loss caused by fibroids. Good iron intake may be required to prevent iron deficiency anemia from developing.  Document Released: 03/12/2004 Document Revised: 07/24/2011 Document Reviewed: 08/27/2010 [facility] Patient Information 2012 [place], [place]. Urinary Tract Infection Infections of the urinary tract can start in several places. A bladder infection (cystitis), a kidney infection (pyelonephritis), and a prostate infection (prostatitis) are different types of urinary tract infections (UTIs). They usually get better if treated with medicines (antibiotics) that  kill germs. Take all the medicine until it is gone. You or your child may feel better in a few days, but TAKE ALL MEDICINE or the infection may not respond and may become more difficult to treat. HOME CARE INSTRUCTIONS   Drink enough water and fluids to keep the urine clear or pale yellow. Cranberry juice is especially recommended, in addition to large amounts of water.   Avoid caffeine, tea, and carbonated beverages. They tend to irritate the bladder.   Alcohol may irritate the prostate.   Only take over-the-counter or prescription medicines for pain, discomfort, or fever as directed by your caregiver.  To prevent further infections:  Empty the bladder often. Avoid holding urine for long periods of time.   After a bowel movement, women should cleanse from front to back. Use each tissue only once.   Empty the bladder before and after sexual intercourse.  FINDING OUT THE RESULTS OF YOUR TEST Not all test results are available during your visit. If your or your child's test results are not back during the visit, make an appointment with your caregiver to find out the results. Do not assume everything is normal if you have not heard from your caregiver or the medical facility. It is important for you to follow up on all test results. SEEK MEDICAL CARE IF:   There is back pain.   Your baby is older than 3 months with a rectal temperature of 100.5 F (38.1 C) or higher for more than 1 day.   Your or your child's problems (symptoms) are no better in 3 days. Return sooner if you or your child is getting worse.  SEEK IMMEDIATE MEDICAL CARE IF:   There is severe back pain or lower abdominal pain.   You or your child develops chills.   You have a fever.   Your baby is older than 3 months with a rectal temperature of 102 F (38.9 C) or higher.   Your baby is 50 months old or younger with a rectal temperature of 100.4 F (38 C) or higher.   There is nausea or vomiting.   There is  continued burning or discomfort with urination.  MAKE SURE YOU:   Understand these instructions.   Will watch your condition.   Will get help right away if you are not doing well or get worse.  Document Released: 05/14/2005 Document Revised: 07/24/2011 Document Reviewed: 12/17/2006 [facility] Patient Information 2012 [place], [place].Vaginal Delivery Care After  Change your pad on each trip to the bathroom.   Wipe gently with toilet paper during your hospital stay. Always wipe from front to back. A spray bottle with warm tap water could also be used or a towelette if available.   Place  your soiled pad and toilet paper in a bathroom wastebasket with a plastic bag liner.   During your hospital stay, save any clots. If you pass a clot while on the toilet, do not flush it. Also, if your vaginal flow seems excessive to you, notify nursing personnel.   The first time you get out of bed after delivery, wait for assistance from a nurse. Do not get up alone at any time if you feel weak or dizzy.   Bend and extend your ankles forcefully so that you feel the calves of your legs get hard. Do this 6 times every hour when you are in bed and awake.   Do not sit with one foot under you, dangle your legs over the edge of the bed, or maintain a position that hinders the circulation in your legs.   Many women experience after pains for 2 to 3 days after delivery. These after pains are mild uterine contractions. Ask the nurse for a pain medication if you need something for this. Sometimes breastfeeding stimulates after pains; if you find this to be true, ask for the medication  -  hour before the next feeding.   For you and your infant's protection, do not go beyond the door(s) of the obstetric unit. Do not carry your baby in your arms in the hallway. When taking your baby to and from your room, put your baby in the bassinet and push the bassinet.   Mothers may have their babies in their room as much as  they desire.  Document Released: 08/01/2000 Document Revised: 07/24/2011 Document Reviewed: 07/02/2007 [facility] Patient Information 2012 [place], [place].

## 2012-02-17 NOTE — Anesthesia Postprocedure Evaluation (Signed)
  Anesthesia Post-op Note  Patient: Jo Graham  Procedure(s) Performed: * Lumbar Epidural for L&D *  Patient Location: Mother/Baby  Anesthesia Type: Epidural  Level of Consciousness: awake, alert  and oriented  Airway and Oxygen Therapy: Patient Spontanous Breathing  Post-op Pain: none  Post-op Assessment: Post-op Vital signs reviewed, Patient's Cardiovascular Status Stable, Respiratory Function Stable, Patent Airway, No signs of Nausea or vomiting, Adequate PO intake, Pain level controlled, No headache, No backache, No residual numbness and No residual motor weakness  Post-op Vital Signs: Reviewed and stable  Complications: No apparent anesthesia complications

## 2012-02-17 NOTE — Discharge Summary (Signed)
Physician Discharge Summary  Patient ID: Jo Graham MRN: [id number] DOB/AGE: [birth year] y.o.  Admit date: 02/15/2012 Discharge date: 02/17/2012  Admission Diagnoses: term pg active labor  Pregnancy as incidental finding   .  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  Late prenatal care   .  Hemorrhoids   .    Marland Kitchen    Needs pap and colpo pp.     Discharge Diagnoses:  Principal Problem:  *Vaginal delivery normal involution nonlacating  Discharged Condition: stable  Hospital Course: active labor, SVD, perinurethral lac, normal involution, undecided regarding birth control  Consults: None  Significant Diagnostic Studies: labs:  Hemoglobin & Hematocrit     Component Value Date/Time   HGB 9.3* 02/16/2012 0521   HCT 28.4* 02/16/2012 0521     Treatments: IV hydration  Discharge Exam: Blood pressure 108/66, pulse 83, temperature 98 F (36.7 C), temperature source Oral, resp. rate 18, height 5\' 2"  (1.575 m), weight 159 lb (72.122 kg), last menstrual period 05/05/2011, SpO2 99.00%, unknown if currently breastfeeding. General appearance: alert, cooperative and no distress S: comfortable, little bleeding, slept    bottle feeding O VSS     abd soft, nt, ff      sm  Flow perineum clean intact periurethral lac well approximated no redness or edema     -Homans sign bilaterally,      No edema Disposition: 01-Home or Self Care  Discharge Orders    Future Appointments: Provider: Department: Dept Phone: Center:   03/29/2012 2:15 PM Michael Litter, MD [facility] [phone] None     Future Orders Please Complete By Expires   OB RESULTS CONSOLE HIV antibody      Comments:   This external order was created through the Results Console.   OB RESULTS CONSOLE Hepatitis B surface antigen      Comments:   This external order was created through the Results Console.   OB RESULTS CONSOLE HIV antibody      Comments:   This external order was  created through the Results Console.   OB RESULTS CONSOLE ABO/Rh      Comments:   This external order was created through the Results Console.   OB RESULTS CONSOLE Antibody Screen      Comments:   This external order was created through the Results Console.     Medication List  As of 02/17/2012  9:12 AM   ASK your doctor about these medications         prenatal multivitamin Tabs   Take 1 tablet by mouth daily.           f/o 6 weeks office, [facility] handbook.  SignedLavera Guise 02/17/2012, 9:12 AM

## 2012-03-26 ENCOUNTER — Ambulatory Visit: Payer: Medicaid Other | Admitting: Obstetrics and Gynecology

## 2012-03-29 ENCOUNTER — Ambulatory Visit: Payer: Medicaid Other | Admitting: Obstetrics and Gynecology

## 2012-03-30 ENCOUNTER — Ambulatory Visit (INDEPENDENT_AMBULATORY_CARE_PROVIDER_SITE_OTHER): Payer: Medicaid Other | Admitting: Obstetrics and Gynecology

## 2012-03-30 ENCOUNTER — Encounter: Payer: Self-pay | Admitting: Obstetrics and Gynecology

## 2012-03-30 MED ORDER — ETONOGESTREL-ETHINYL ESTRADIOL 0.12-0.015 MG/24HR VA RING: VAGINAL_RING | VAGINAL | Status: DC

## 2012-03-30 NOTE — Progress Notes (Signed)
Date of delivery: 02/15/12 Female Name: Jo Graham Vaginal delivery:yes Cesarean section:no Tubal ligation:no GDM:no Breast Feeding:no Bottle Feeding:yes Post-Partum Blues:no Abnormal pap:yes 09/17/11 LSIL Normal GU function: yes Normal GI function:yes Returning to work:yes EPDS: 0 BP 110/80  Temp 98.2 F (36.8 C) (Oral)  Wt 144 lb (65.318 kg)  LMP 03/20/2012  Breastfeeding? No female who presents for a postpartum visit.  ABD: soft nontender GU: vulva normal no masses seen.  Vagina normal in appearance.  Cervix is parous and NT.  Uterus normal size.  No adnexal tenderness bilaterally or fullness EXT: no [facility] NuvaRing vaginal inserts.  Pt needs to start with menses, she has had IC.  Use back up for a month May resume intercourse , exercise and normal activity RT 4-6 weeks for colpo

## 2012-03-30 NOTE — Patient Instructions (Signed)
Colposcopy Colposcopy is a procedure that uses a special lighted microscope (colposcope). It examines your cervix and vagina, or the area around the outside of the vagina, for signs of disease or abnormalities in the cells. You may be sent to a specialist (gynecologist) to do the colposcopy. A biopsy (tissue sample) may be collected during a colposcopy, if the caregiver finds any unusual cells. The biopsy is sent to the lab for further testing, and the results are reported back to your caregiver. A WOMAN MAY NEED THIS PROCEDURE IF:  She has had an abnormal pap smear (taking cells from the cervix for testing).   She has a sore on her cervix, and a Pap test was normal.   The Pap test suggests human papilloma virus (HPV). This virus can cause genital warts and is linked to the development of cervical cancer.   She has genital warts on the cervix, or in or around the outside of the vagina.   Her mother took the drug DES while pregnant.   She has painful intercourse.   She has vaginal bleeding, especially after sexual intercourse.   There is a need to evaluate the results of previous treatment.  BEFORE THE PROCEDURE   Colposcopy is done when you are not having a menstrual period.   For 24 hours before the colposcopy, do not:   Douche.   Use tampons.   Use medicines, creams, or suppositories in the vagina.   Have sexual intercourse.  PROCEDURE   A colposcopy is done while a woman is lying on her back with her feet in foot rests (stirrups).   A speculum is placed inside the vagina to keep it open and to allow the caregiver to see the cervix. This is the same instrument used to do a pap smear.   The colposcope is placed outside the vagina. It is used to magnify and examine the cervix, vagina, and the area around the outside of the vagina.   A small amount of liquid solution is placed on the area that is to be viewed. This solution is placed on with a cotton applicator. This solution  makes it easier to see the abnormal cells.   Your caregiver will suck out mucus and cells from the canal of the cervix.   Small pieces of tissue for biopsy may be taken at the same time. You may feel mild pain or discomfort when this is done.   Your caregiver will record the location of the abnormal areas and send the tissue samples to a lab for analysis.   If your caregiver biopsies the vagina or outside of the vagina, a local anesthetic (novocaine) is usually given.  AFTER THE PROCEDURE   You may have some cramping that often goes away in a few minutes. You may have some soreness for a couple of days.   You may take over-the-counter pain medicine as advised by your caregiver. Do not take aspirin because it can cause bleeding.   Lie down for a few minutes if you feel lightheaded.   You may have some bleeding or dark discharge that should stop in a few days.   You may need to wear a sanitary pad for a few days.  HOME CARE INSTRUCTIONS   Avoid sex, douching, and using tampons for a week or as directed.   Only take medicine as directed by your caregiver.   Continue to take birth control pills, if you are on them.   Not all test results are   available during your visit. If your test results are not back during the visit, make an appointment with your caregiver to find out the results. Do not assume everything is normal if you have not heard from your caregiver or the medical facility. It is important for you to follow up on all of your test results.   Follow your caregiver's advice regarding medicines, activity, follow-up visits, and follow-up Pap tests.  SEEK MEDICAL CARE IF:   You develop a rash.   You have problems with your medicine.  SEEK IMMEDIATE MEDICAL CARE IF:  You are bleeding heavily or are passing blood clots.   You develop a fever over 102 F (38.9 C), with or without chills.   You have abnormal vaginal discharge.   You are having cramps that do not go away  after taking your pain medicine.   You feel lightheaded, dizzy, or faint.   You develop stomach pain.  Document Released: 10/25/2002 Document Revised: 07/24/2011 Document Reviewed: 06/07/2009 [employer] Patient Information 2012 [employer]. 

## 2014-06-19 ENCOUNTER — Encounter: Payer: Self-pay | Admitting: Obstetrics and Gynecology

## 2016-10-21 ENCOUNTER — Encounter (HOSPITAL_COMMUNITY): Payer: Self-pay | Admitting: *Deleted

## 2016-10-21 ENCOUNTER — Inpatient Hospital Stay (HOSPITAL_COMMUNITY)
Admission: AD | Admit: 2016-10-21 | Discharge: 2016-10-21 | Disposition: A | Discharge status: Home / Self Care | Payer: Managed Care, Other (non HMO) | Source: Ambulatory Visit | Attending: Obstetrics & Gynecology | Admitting: Obstetrics & Gynecology

## 2016-10-21 ENCOUNTER — Inpatient Hospital Stay (HOSPITAL_COMMUNITY): Payer: Managed Care, Other (non HMO)

## 2016-10-21 DIAGNOSIS — Z3A01 Less than 8 weeks gestation of pregnancy: Secondary | ICD-10-CM | POA: Insufficient documentation

## 2016-10-21 DIAGNOSIS — O3680X0 Pregnancy with inconclusive fetal viability, not applicable or unspecified: Secondary | ICD-10-CM | POA: Diagnosis not present

## 2016-10-21 DIAGNOSIS — Z6790 Unspecified blood type, Rh positive: Secondary | ICD-10-CM

## 2016-10-21 DIAGNOSIS — Z3490 Encounter for supervision of normal pregnancy, unspecified, unspecified trimester: Secondary | ICD-10-CM

## 2016-10-21 DIAGNOSIS — O209 Hemorrhage in early pregnancy, unspecified: Secondary | ICD-10-CM | POA: Diagnosis not present

## 2016-10-21 DIAGNOSIS — O4690 Antepartum hemorrhage, unspecified, unspecified trimester: Secondary | ICD-10-CM | POA: Diagnosis not present

## 2016-10-21 LAB — CBC
HCT: 39.4 % (ref 36.0–46.0)
HEMOGLOBIN: 12.9 g/dL (ref 12.0–15.0)
MCH: 30.1 pg (ref 26.0–34.0)
MCHC: 32.7 g/dL (ref 30.0–36.0)
MCV: 91.8 fL (ref 78.0–100.0)
PLATELETS: 269 10*3/uL (ref 150–400)
RBC: 4.29 MIL/uL (ref 3.87–5.11)
RDW: 13.8 % (ref 11.5–15.5)
WBC: 10 10*3/uL (ref 4.0–10.5)

## 2016-10-21 LAB — URINALYSIS, ROUTINE W REFLEX MICROSCOPIC
Bilirubin Urine: NEGATIVE
Glucose, UA: NEGATIVE mg/dL
Ketones, ur: NEGATIVE mg/dL
Nitrite: NEGATIVE
PROTEIN: 30 mg/dL — AB
Specific Gravity, Urine: 1.028 (ref 1.005–1.030)
pH: 7 (ref 5.0–8.0)

## 2016-10-21 LAB — WET PREP, GENITAL
Sperm: NONE SEEN
Trich, Wet Prep: NONE SEEN
Yeast Wet Prep HPF POC: NONE SEEN

## 2016-10-21 LAB — POCT PREGNANCY, URINE: PREG TEST UR: POSITIVE — AB

## 2016-10-21 LAB — HCG, QUANTITATIVE, PREGNANCY: hCG, Beta Chain, Quant, S: 3618 m[IU]/mL — ABNORMAL HIGH (ref <5)

## 2016-10-21 NOTE — Discharge Instructions (Signed)
Vaginal Bleeding During Pregnancy, First Trimester °A small amount of bleeding (spotting) from the vagina is relatively common in early pregnancy. It usually stops on its own. Various things may cause bleeding or spotting in early pregnancy. Some bleeding may be related to the pregnancy, and some may not. In most cases, the bleeding is normal and is not a problem. However, bleeding can also be a sign of something serious. Be sure to tell your health care provider about any vaginal bleeding right away. °Some possible causes of vaginal bleeding during the first trimester include: °· Infection or inflammation of the cervix. °· Growths (polyps) on the cervix. °· Miscarriage or threatened miscarriage. °· Pregnancy tissue has developed outside of the uterus and in a fallopian tube (tubal pregnancy). °· Tiny cysts have developed in the uterus instead of pregnancy tissue (molar pregnancy). °Follow these instructions at home: °Watch your condition for any changes. The following actions may help to lessen any discomfort you are feeling: °· Follow your health care provider's instructions for limiting your activity. If your health care provider orders bed rest, you may need to stay in bed and only get up to use the bathroom. However, your health care provider may allow you to continue light activity. °· If needed, make plans for someone to help with your regular activities and responsibilities while you are on bed rest. °· Keep track of the number of pads you use each day, how often you change pads, and how soaked (saturated) they are. Write this down. °· Do not use tampons. Do not douche. °· Do not have sexual intercourse or orgasms until approved by your health care provider. °· If you pass any tissue from your vagina, save the tissue so you can show it to your health care provider. °· Only take over-the-counter or prescription medicines as directed by your health care provider. °· Do not take aspirin because it can make you  bleed. °· Keep all follow-up appointments as directed by your health care provider. °Contact a health care provider if: °· You have any vaginal bleeding during any part of your pregnancy. °· You have cramps or labor pains. °· You have a fever, not controlled by medicine. °Get help right away if: °· You have severe cramps in your back or belly (abdomen). °· You pass large clots or tissue from your vagina. °· Your bleeding increases. °· You feel light-headed or weak, or you have fainting episodes. °· You have chills. °· You are leaking fluid or have a gush of fluid from your vagina. °· You pass out while having a bowel movement. °This information is not intended to replace advice given to you by your health care provider. Make sure you discuss any questions you have with your health care provider. °Document Released: 05/14/2005 Document Revised: 01/10/2016 Document Reviewed: 04/11/2013 °Elsevier Interactive Patient Education © 2017 [employer] ° °

## 2016-10-21 NOTE — MAU Note (Signed)
Pt presents to MAU with complaints of vaginal bleeding when she wipes with lower abdominal cramping that started earlier today. PT had a positive pregnancy test at home on Sunday

## 2016-10-21 NOTE — MAU Provider Note (Signed)
History     CSN: [id number]  Arrival date and time: 10/21/16 1415   First Provider Initiated Contact with Patient 10/21/16 1530      Chief Complaint  Patient presents with  . Vaginal Bleeding   [id number] @[redacted]w[redacted]d  by sure LMP here with VB and cramping. She reports large amt of bright red blood seen in the toilet about 2 hrs ago. No VB since. Lower abdominal cramping around the same time and has since resolved. At that time the pain was 6/10. She did not use anything for the pain. No recent IC. No vaginal discharge. No urinary sx. No fevers.   OB History    Gravida Para Term Preterm AB Living   3 2 1 1   2    SAB TAB Ectopic Multiple Live Births           2      Past Medical History:  Diagnosis Date  . H/O candidiasis   . H/O cystitis    x1    Past Surgical History:  Procedure Laterality Date  . NO PAST SURGERIES      Family History  Problem Relation Age of Onset  . Asthma Maternal Aunt   . Cancer Maternal Aunt     Social History  Substance Use Topics  . Smoking status: Never Smoker  . Smokeless tobacco: Never Used  . Alcohol use No    Allergies: No Known Allergies  Prescriptions Prior to Admission  Medication Sig Dispense Refill Last Dose  . etonogestrel-ethinyl estradiol (NUVARING) 0.12-0.015 MG/24HR vaginal ring Insert vaginally and leave in place for 3 consecutive weeks, then remove for 1 week. 1 each 12   . ferrous sulfate 325 (65 FE) MG tablet Take 1 tablet (325 mg total) by mouth 2 (two) times daily. 60 tablet 1 Taking  . Prenatal Vit-Fe Fumarate-FA (PRENATAL MULTIVITAMIN) TABS Take 1 tablet by mouth daily.   Taking    Review of Systems  Gastrointestinal: Positive for abdominal pain.  Genitourinary: Positive for vaginal bleeding.   Physical Exam   Blood pressure 159/85, pulse (!) 124, temperature 98.2 F (36.8 C), resp. rate 18, height 5\' 3"  (1.6 m), weight 92.1 kg (203 lb), last menstrual period 09/08/2016.  Physical Exam  Nursing note and vitals  reviewed. Constitutional: She is oriented to person, place, and time. She appears well-developed and well-nourished. No distress (appears comfortable).  HENT:  Head: Normocephalic and atraumatic.  Neck: Normal range of motion.  Respiratory: Effort normal.  GI: Soft. She exhibits no distension and no mass. There is no tenderness. There is no rebound and no guarding.  Genitourinary:  Genitourinary Comments: External: no lesions or erythema Vagina: rugated, parous, small amt drk bloody discharge Uterus: non enlarged, anteverted, non tender, no CMT Adnexae: no masses, no tenderness left, no tenderness right   Musculoskeletal: Normal range of motion.  Neurological: She is alert and oriented to person, place, and time.  Skin: Skin is warm and dry.  Psychiatric: She has a normal mood and affect.   Results for orders placed or performed during the hospital encounter of 10/21/16 (from the past 24 hour(s))  Urinalysis, Routine w reflex microscopic     Status: Abnormal   Collection Time: 10/21/16  2:30 PM  Result Value Ref Range   Color, Urine YELLOW YELLOW   APPearance HAZY (A) CLEAR   Specific Gravity, Urine 1.028 1.005 - 1.030   pH 7.0 5.0 - 8.0   Glucose, UA NEGATIVE NEGATIVE mg/dL   Hgb urine dipstick LARGE (  A) NEGATIVE   Bilirubin Urine NEGATIVE NEGATIVE   Ketones, ur NEGATIVE NEGATIVE mg/dL   Protein, ur 30 (A) NEGATIVE mg/dL   Nitrite NEGATIVE NEGATIVE   Leukocytes, UA SMALL (A) NEGATIVE   RBC / HPF 6-30 0 - 5 RBC/hpf   WBC, UA 6-30 0 - 5 WBC/hpf   Bacteria, UA RARE (A) NONE SEEN   Squamous Epithelial / LPF 6-30 (A) NONE SEEN   Mucous PRESENT   Pregnancy, urine POC     Status: Abnormal   Collection Time: 10/21/16  2:40 PM  Result Value Ref Range   Preg Test, Ur POSITIVE (A) NEGATIVE  CBC     Status: None   Collection Time: 10/21/16  3:32 PM  Result Value Ref Range   WBC 10.0 4.0 - 10.5 K/uL   RBC 4.29 3.87 - 5.11 MIL/uL   Hemoglobin 12.9 12.0 - 15.0 g/dL   HCT 16.1 09.6 -  04.5 %   MCV 91.8 78.0 - 100.0 fL   MCH 30.1 26.0 - 34.0 pg   MCHC 32.7 30.0 - 36.0 g/dL   RDW 40.9 81.1 - 91.4 %   Platelets 269 150 - 400 K/uL  hCG, quantitative, pregnancy     Status: Abnormal   Collection Time: 10/21/16  3:32 PM  Result Value Ref Range   hCG, Beta Chain, Quant, S 3,618 (H) <5 mIU/mL  Wet prep, genital     Status: Abnormal   Collection Time: 10/21/16  3:39 PM  Result Value Ref Range   Yeast Wet Prep HPF POC NONE SEEN NONE SEEN   Trich, Wet Prep NONE SEEN NONE SEEN   Clue Cells Wet Prep HPF POC PRESENT (A) NONE SEEN   WBC, Wet Prep HPF POC MODERATE (A) NONE SEEN   Sperm NONE SEEN    US Ob Comp Less 14 Wks  Result Date: 10/21/2016 CLINICAL DATA:  Pregnant, vaginal bleeding EXAM: OBSTETRIC <14 WK [place] AND TRANSVAGINAL OB US TECHNIQUE: Both transabdominal and transvaginal ultrasound examinations were performed for complete evaluation of the gestation as well as the maternal uterus, adnexal regions, and pelvic cul-de-sac. Transvaginal technique was performed to assess early pregnancy. COMPARISON:  None. FINDINGS: Intrauterine gestational sac: None Yolk sac:  Not Visualized. Embryo:  Not Visualized. Subchorionic hemorrhage:  None visualized. Maternal uterus/adnexae: Endometrial complex measures 9 mm. Bilateral ovaries are within normal limits. No free fluid. IMPRESSION: No IUP is visualized. By definition, in the setting of a positive pregnancy test, this reflects a pregnancy of unknown location. Differential considerations include early normal IUP, abnormal IUP/missed abortion, or nonvisualized ectopic pregnancy. Serial beta HCG is suggested. Consider repeat pelvic ultrasound in 14 days, as clinically warranted. Electronically Signed   By: Charline Bills M.D.   On: 10/21/2016 16:30   US Ob Transvaginal  Result Date: 10/21/2016 CLINICAL DATA:  Pregnant, vaginal bleeding EXAM: OBSTETRIC <14 WK [place] AND TRANSVAGINAL OB US TECHNIQUE: Both transabdominal and transvaginal ultrasound  examinations were performed for complete evaluation of the gestation as well as the maternal uterus, adnexal regions, and pelvic cul-de-sac. Transvaginal technique was performed to assess early pregnancy. COMPARISON:  None. FINDINGS: Intrauterine gestational sac: None Yolk sac:  Not Visualized. Embryo:  Not Visualized. Subchorionic hemorrhage:  None visualized. Maternal uterus/adnexae: Endometrial complex measures 9 mm. Bilateral ovaries are within normal limits. No free fluid. IMPRESSION: No IUP is visualized. By definition, in the setting of a positive pregnancy test, this reflects a pregnancy of unknown location. Differential considerations include early normal IUP, abnormal IUP/missed abortion, or  nonvisualized ectopic pregnancy. Serial beta HCG is suggested. Consider repeat pelvic ultrasound in 14 days, as clinically warranted. Electronically Signed   By: Charline Bills M.D.   On: 10/21/2016 16:30   MAU Course  Procedures  MDM Labs and [place] ordered and reviewed. No IUP seen on [place], small complex in endometrium. Would expect to see IUGS with quant of 3k and given onset of VB this is likely a failed pregnancy but cannot r/o early pregnancy or ectopic. Will f/u quant in 2 days. Stable for discharge home.   Assessment and Plan   1. Pregnancy of unknown anatomic location   2. Vaginal bleeding in pregnancy   3. Blood type, Rh positive    Discharge home Follow up in [facility] in 2 days at 11:00 for quant HCG Bleeding/ectopic precautions  Allergies as of 10/21/2016   No Known Allergies     Medication List    STOP taking these medications   etonogestrel-ethinyl estradiol 0.12-0.015 MG/24HR vaginal ring Commonly known as:  NUVARING   ferrous sulfate 325 (65 FE) MG tablet      Donette Larry, CNM 10/21/2016, 3:33 PM

## 2016-10-22 ENCOUNTER — Inpatient Hospital Stay (HOSPITAL_COMMUNITY)
Admission: AD | Admit: 2016-10-22 | Discharge: 2016-10-22 | Disposition: A | Discharge status: Home / Self Care | Payer: Managed Care, Other (non HMO) | Source: Ambulatory Visit | Attending: Obstetrics & Gynecology | Admitting: Obstetrics & Gynecology

## 2016-10-22 ENCOUNTER — Encounter (HOSPITAL_COMMUNITY): Payer: Self-pay | Admitting: *Deleted

## 2016-10-22 ENCOUNTER — Inpatient Hospital Stay (HOSPITAL_COMMUNITY): Payer: Managed Care, Other (non HMO)

## 2016-10-22 DIAGNOSIS — O009 Unspecified ectopic pregnancy without intrauterine pregnancy: Secondary | ICD-10-CM | POA: Diagnosis not present

## 2016-10-22 DIAGNOSIS — O0281 Inappropriate change in quantitative human chorionic gonadotropin (hCG) in early pregnancy: Secondary | ICD-10-CM | POA: Diagnosis not present

## 2016-10-22 DIAGNOSIS — Z3A01 Less than 8 weeks gestation of pregnancy: Secondary | ICD-10-CM | POA: Insufficient documentation

## 2016-10-22 DIAGNOSIS — M549 Dorsalgia, unspecified: Secondary | ICD-10-CM | POA: Diagnosis present

## 2016-10-22 DIAGNOSIS — O209 Hemorrhage in early pregnancy, unspecified: Secondary | ICD-10-CM

## 2016-10-22 LAB — URINALYSIS, ROUTINE W REFLEX MICROSCOPIC
BACTERIA UA: NONE SEEN
BILIRUBIN URINE: NEGATIVE
Glucose, UA: NEGATIVE mg/dL
Ketones, ur: 20 mg/dL — AB
Nitrite: NEGATIVE
Protein, ur: NEGATIVE mg/dL
SPECIFIC GRAVITY, URINE: 1.024 (ref 1.005–1.030)
pH: 7 (ref 5.0–8.0)

## 2016-10-22 LAB — CBC
HEMATOCRIT: 37 % (ref 36.0–46.0)
Hemoglobin: 12.4 g/dL (ref 12.0–15.0)
MCH: 30.5 pg (ref 26.0–34.0)
MCHC: 33.5 g/dL (ref 30.0–36.0)
MCV: 91.1 fL (ref 78.0–100.0)
Platelets: 215 10*3/uL (ref 150–400)
RBC: 4.06 MIL/uL (ref 3.87–5.11)
RDW: 13.6 % (ref 11.5–15.5)
WBC: 7.3 10*3/uL (ref 4.0–10.5)

## 2016-10-22 LAB — AST: AST: 21 U/L (ref 15–41)

## 2016-10-22 LAB — BUN: BUN: 7 mg/dL (ref 6–20)

## 2016-10-22 LAB — HCG, QUANTITATIVE, PREGNANCY: hCG, Beta Chain, Quant, S: 4498 m[IU]/mL — ABNORMAL HIGH (ref <5)

## 2016-10-22 LAB — CREATININE, SERUM
Creatinine, Ser: 0.71 mg/dL (ref 0.44–1.00)
GFR calc non Af Amer: >60 mL/min (ref >60)

## 2016-10-22 LAB — GC/CHLAMYDIA PROBE AMP (~~LOC~~) NOT AT ARMC
Chlamydia: NEGATIVE
NEISSERIA GONORRHEA: NEGATIVE

## 2016-10-22 LAB — HIV ANTIBODY (ROUTINE TESTING W REFLEX): HIV Screen 4th Generation wRfx: NONREACTIVE

## 2016-10-22 LAB — RPR: RPR Ser Ql: NONREACTIVE

## 2016-10-22 MED ORDER — METHOTREXATE INJECTION FOR WOMEN'S HOSPITAL
50.0000 mg/m2 | Freq: Once | INTRAMUSCULAR | Status: AC
  Administered 2016-10-22: 100 mg via INTRAMUSCULAR
  Filled 2016-10-22: qty 2

## 2016-10-22 MED ORDER — PROMETHAZINE HCL 25 MG PO TABS: 25.0000 mg | ORAL_TABLET | Freq: Four times a day (QID) | ORAL | 0 refills | Status: DC | PRN

## 2016-10-22 NOTE — MAU Note (Signed)
Patient c/o increasing lower back pain, abdominal cramping, and vaginal bleeding since being seen yesterday. Pain in cramping in nature. Endorses having gone through 3 pads through the night. Patient tearful during triage.

## 2016-10-22 NOTE — MAU Provider Note (Signed)
History     CSN: [id number]  Arrival date and time: 10/22/16 7829   First Provider Initiated Contact with Patient 10/22/16 1016      Chief Complaint  Patient presents with  . Vaginal Bleeding  . Back Pain  . Abdominal Cramping   HPI Jo Graham is a 31 y.o. [id number] at [redacted]w[redacted]d by LMP who presents with back pain & vaginal bleeding. Patient was seen in MAU yesterday with same complaint. Yesterday had gush of blood in toilet PTA. Ultrasound showed no IUP or adnexal mass with BHCG 3618. Pt was to return tomorrow for repeat bhcg.  Today reports continued vaginal bleeding like a light period. Has not saturated pads or passed clots. Reports worsening low back pain that radiates to low abdomen. Rates pain 8/10. Has not treated. Nothing makes better or worse. Denies fever, dysuria, vaginal discharge, n/v/d, constipation.   OB History    Gravida Para Term Preterm AB Living   3 2 1 1   2    SAB TAB Ectopic Multiple Live Births           2      Past Medical History:  Diagnosis Date  . H/O candidiasis   . H/O cystitis    x1    Past Surgical History:  Procedure Laterality Date  . NO PAST SURGERIES      Family History  Problem Relation Age of Onset  . Asthma Maternal Aunt   . Cancer Maternal Aunt     Social History  Substance Use Topics  . Smoking status: Never Smoker  . Smokeless tobacco: Never Used  . Alcohol use No    Allergies: No Known Allergies  No prescriptions prior to admission.    Review of Systems  Constitutional: Negative.   Respiratory: Negative for cough.   Gastrointestinal: Positive for abdominal pain. Negative for constipation, diarrhea, nausea and vomiting.  Genitourinary: Positive for vaginal bleeding. Negative for dysuria and vaginal discharge.  Musculoskeletal: Positive for back pain.   Physical Exam   Blood pressure 142/67, pulse 95, temperature 98 F (36.7 C), temperature source Oral, resp. rate 18, height 5\' 3"  (1.6 m), weight 202 lb (91.6 kg),  last menstrual period 09/08/2016, SpO2 100 %.  Physical Exam  Nursing note and vitals reviewed. Constitutional: She is oriented to person, place, and time. She appears well-developed and well-nourished. No distress.  HENT:  Head: Normocephalic and atraumatic.  Eyes: Conjunctivae are normal. Right eye exhibits no discharge. Left eye exhibits no discharge. No scleral icterus.  Neck: Normal range of motion.  Cardiovascular: Normal rate, regular rhythm and normal heart sounds.   No murmur heard. Respiratory: Effort normal and breath sounds normal. No respiratory distress. She has no wheezes.  GI: Soft. Bowel sounds are normal. She exhibits no distension. There is no tenderness. There is no rigidity, no rebound, no guarding and no CVA tenderness.  Genitourinary: Uterus normal. Cervix exhibits no motion tenderness. Right adnexum displays no mass and no tenderness. Left adnexum displays no mass and no tenderness.  Genitourinary Comments: Cervix closed. Minimal amount of dark red blood.   Neurological: She is alert and oriented to person, place, and time.  Skin: Skin is warm and dry. She is not diaphoretic.  Psychiatric: She has a normal mood and affect. Her behavior is normal. Judgment and thought content normal.    MAU Course  Procedures Results for orders placed or performed during the hospital encounter of 10/22/16 (from the past 24 hour(s))  Urinalysis, Routine w reflex  microscopic     Status: Abnormal   Collection Time: 10/22/16  9:56 AM  Result Value Ref Range   Color, Urine YELLOW YELLOW   APPearance CLEAR CLEAR   Specific Gravity, Urine 1.024 1.005 - 1.030   pH 7.0 5.0 - 8.0   Glucose, UA NEGATIVE NEGATIVE mg/dL   Hgb urine dipstick MODERATE (A) NEGATIVE   Bilirubin Urine NEGATIVE NEGATIVE   Ketones, ur 20 (A) NEGATIVE mg/dL   Protein, ur NEGATIVE NEGATIVE mg/dL   Nitrite NEGATIVE NEGATIVE   Leukocytes, UA TRACE (A) NEGATIVE   RBC / HPF 0-5 0 - 5 RBC/hpf   WBC, UA 0-5 0 - 5  WBC/hpf   Bacteria, UA NONE SEEN NONE SEEN   Squamous Epithelial / LPF 0-5 (A) NONE SEEN   Mucous PRESENT   hCG, quantitative, pregnancy     Status: Abnormal   Collection Time: 10/22/16 10:23 AM  Result Value Ref Range   hCG, Beta Chain, Quant, S 4,498 (H) <5 mIU/mL  CBC     Status: None   Collection Time: 10/22/16 10:23 AM  Result Value Ref Range   WBC 7.3 4.0 - 10.5 K/uL   RBC 4.06 3.87 - 5.11 MIL/uL   Hemoglobin 12.4 12.0 - 15.0 g/dL   HCT 96.037.0 45.436.0 - 09.846.0 %   MCV 91.1 78.0 - 100.0 fL   MCH 30.5 26.0 - 34.0 pg   MCHC 33.5 30.0 - 36.0 g/dL   RDW 11.913.6 14.711.5 - 82.915.5 %   Platelets 215 150 - 400 K/uL  AST     Status: None   Collection Time: 10/22/16 10:23 AM  Result Value Ref Range   AST 21 15 - 41 U/L  BUN     Status: None   Collection Time: 10/22/16 10:23 AM  Result Value Ref Range   BUN 7 6 - 20 mg/dL  Creatinine, serum     Status: None   Collection Time: 10/22/16 10:23 AM  Result Value Ref Range   Creatinine, Ser 0.71 0.44 - 1.00 mg/dL   GFR calc non Af Amer >60 >60 mL/min   GFR calc Af Amer >60 >60 mL/min   [place] Ob Transvaginal  Result Date: 10/22/2016 CLINICAL DATA:  Increasing pain.  Vaginal bleeding. EXAM: TRANSVAGINAL OB ULTRASOUND TECHNIQUE: Transvaginal ultrasound was performed for complete evaluation of the gestation as well as the maternal uterus, adnexal regions, and pelvic cul-de-sac. COMPARISON:  10/22/2015 FINDINGS: Intrauterine gestational sac: None visualized Yolk sac:  Not visualized Embryo:  Not visualized Cardiac Activity: Heart Rate:  bpm MSD:   mm    w     d CRL:     mm    w  d                  US EDC: Subchorionic hemorrhage:  None visualized. Maternal uterus/adnexae: Within the right adnexa adjacent to the right side of the uterus, there is a 1.5 x 1.5 x 1.2 complex hypoechoic area surrounded by hyperechoic rim. If this represented a day an ectopic pregnancy and gestational sac, this would be 5 weeks 2 days by mean sac diameter. Small amount of free fluid in  the pelvis. IMPRESSION: Findings suspicious for right ectopic pregnancy. Critical Value/emergent results were called by telephone at the time of interpretation on 10/22/2016 at 12:38 pm to [place] Erisha Paugh , who verbally acknowledged these results. Electronically Signed   By: Charlett NoseKevin  Dover M.D.   On: 10/22/2016 12:39    MDM BHCG, CBC O positive Pt  declines pain medication BHCG up to 4498 from 3618 in less than 24 hours -- will order ultrasound Ultrasound shows 1.5 cm mass in the right adnexa suspicious for ectopic pregnancy, no IUP, small amount of free fluid Discussed results with Dr. Ameisha Mcclellan Fulling. Will offer methotrexate as tx for ectopic pregnancy.   The risks of methotrexate were reviewed including failure requiring repeat dosing or eventual surgery. She understands that methotrexate involves frequent return visits to monitor lab values and that she remains at risk of ectopic rupture until her beta is less than assay. ?The patient opts to proceed with methotrexate.  She has no history of hepatic or renal dysfunction, has normal BUN/Cr/LFT's/platelets.  She is felt to be reliable for follow-up. Side effects of photosensitivity & GI upset were discussed.  She knows to avoid direct sunlight and abstain from alcohol, aspirin and aspirin-like products for two weeks. She was counseled to discontinue any MVI with folic acid. ?She understands to follow up on D4 (Saturday) and D7 (Tuesday) for repeat BHCG and was given the instruction sheet. Strict ectopic precautions were reviewed, the patient knows to call with any abdominal pain, vomiting, fainting, or any concerns with her health.  Rh+, no Rhogam necessary Assessment and Plan  A: 1. Ectopic pregnancy without intrauterine pregnancy, unspecified location   2. Inappropriate change in quantitative hCG in early pregnancy   3. Vaginal bleeding in pregnancy, first trimester    P: Discharge home Rx phenergan Take tylenol prn pain Discussed reasons to  return to MAU for worsening condition or s/s ruptured ectopic Ectopic/MTX info sheet given Pt to return to MAU Saturday for day 4 lab  Judeth Horn 10/22/2016, 10:16 AM

## 2016-10-22 NOTE — Discharge Instructions (Signed)
Ectopic Pregnancy °An ectopic pregnancy is when the fertilized egg attaches (implants) outside the uterus. Most ectopic pregnancies occur in one of the tubes where eggs travel from the ovary to the uterus (fallopian tubes), but the implanting can occur in other locations. In rare cases, ectopic pregnancies occur on the ovary, intestine, pelvis, abdomen, or cervix. In an ectopic pregnancy, the fertilized egg does not have the ability to develop into a normal, healthy baby. °A ruptured ectopic pregnancy is one in which tearing or bursting of a fallopian tube causes internal bleeding. Often, there is intense lower abdominal pain, and vaginal bleeding sometimes occurs. Having an ectopic pregnancy can be life-threatening. If this dangerous condition is not treated, it can lead to blood loss, shock, or even death. °What are the causes? °The most common cause of this condition is damage to one of the fallopian tubes. A fallopian tube may be narrowed or blocked, and that keeps the fertilized egg from reaching the uterus. °What increases the risk? °This condition is more likely to develop in women of childbearing age who have different levels of risk. The levels of risk can be divided into three categories. °High risk  °· You have gone through infertility treatment. °· You have had an ectopic pregnancy before. °· You have had surgery on the fallopian tubes, or another surgical procedure, such as an abortion. °· You have had surgery to have the fallopian tubes tied (tubal ligation). °· You have problems or diseases of the fallopian tubes. °· You have been exposed to diethylstilbestrol (DES). This medicine was used until 1971, and it had effects on babies whose mothers took the medicine. °· You become pregnant while using an IUD (intrauterine device) for birth control. °Moderate risk  °· You have a history of infertility. °· You have had an STI (sexually transmitted infection). °· You have a history of pelvic inflammatory  disease (PID). °· You have scarring from endometriosis. °· You have multiple sexual partners. °· You smoke. °Low risk  °· You have had pelvic surgery. °· You use vaginal douches. °· You became sexually active before age 18. °What are the signs or symptoms? °Common symptoms of this condition include normal pregnancy symptoms, such as missing a period, nausea, tiredness, abdominal pain, breast tenderness, and bleeding. However, ectopic pregnancy will have additional symptoms, such as: °· Pain with intercourse. °· Irregular vaginal bleeding or spotting. °· Cramping or pain on one side or in the lower abdomen. °· Fast heartbeat, low blood pressure, and sweating. °· Passing out while having a bowel movement. °Symptoms of a ruptured ectopic pregnancy and internal bleeding may include: °· Sudden, severe pain in the abdomen and pelvis. °· Dizziness, weakness, light-headedness, or fainting. °· Pain in the shoulder or neck area. °How is this diagnosed? °This condition is diagnosed by: °· A pelvic exam to locate pain or a mass in the abdomen. °· A pregnancy test. This blood test checks for the presence as well as the specific level of pregnancy hormone in the bloodstream. °· Ultrasound. This is performed if a pregnancy test is positive. In this test, a probe is inserted into the vagina. The probe will detect a fetus, possibly in a location other than the uterus. °· Taking a sample of uterus tissue (dilation and curettage, or D&C). °· Surgery to perform a visual exam of the inside of the abdomen using a thin, lighted tube that has a tiny camera on the end (laparoscope). °· Culdocentesis. This procedure involves inserting a needle at the   top of the vagina, behind the uterus. If blood is present in this area, it may indicate that a fallopian tube is torn. °How is this treated? °This condition is treated with medicine or surgery. °Medicine  °· An injection of a medicine (methotrexate) may be given to cause the pregnancy tissue to  be absorbed. This medicine may save your fallopian tube. It may be given if: °¨ The diagnosis is made early, with no signs of active bleeding. °¨ The fallopian tube has not ruptured. °¨ You are considered to be a good candidate for the medicine. °Usually, pregnancy hormone blood levels are checked after methotrexate treatment. This is to be sure that the medicine is effective. It may take 4-6 weeks for the pregnancy to be absorbed. Most pregnancies will be absorbed by 3 weeks. °Surgery  °· A laparoscope may be used to remove the pregnancy tissue. °· If severe internal bleeding occurs, a larger cut (incision) may be made in the lower abdomen (laparotomy) to remove the fetus and placenta. This is done to stop the bleeding. °· Part or all of the fallopian tube may be removed (salpingectomy) along with the fetus and placenta. The fallopian tube may also be repaired during the surgery. °· In very rare circumstances, removal of the uterus (hysterectomy) may be required. °· After surgery, pregnancy hormone testing may be done to be sure that there is no pregnancy tissue left. °Whether your treatment is medicine or surgery, you may receive a Rho (D) immune globulin shot to prevent problems with any future pregnancy. This shot may be given if: °· You are Rh-negative and the baby's father is Rh-positive. °· You are Rh-negative and you do not know the Rh type of the baby's father. °Follow these instructions at home: °· Rest and limit your activity after the procedure for as long as told by your health care provider. °· Until your health care provider says that it is safe: °¨ Do not lift anything that is heavier than 10 lb (4.5 kg), or the limit that your health care provider tells you. °¨ Avoid physical exercise and any movement that requires effort (is strenuous). °· To help prevent constipation: °¨ Eat a healthy diet that includes fruits, vegetables, and whole grains. °¨ Drink 6-8 glasses of water per day. °Get help right  away if: °· You develop worsening pain that is not relieved by medicine. °· You have: °¨ A fever or chills. °¨ Vaginal bleeding. °¨ Redness and swelling at the incision site. °¨ Nausea and vomiting. °· You feel dizzy or weak. °· You feel light-headed or you faint. °This information is not intended to replace advice given to you by your health care provider. Make sure you discuss any questions you have with your health care provider. °Document Released: 09/11/2004 Document Revised: 04/02/2016 Document Reviewed: 03/05/2016 °Elsevier Interactive Patient Education © 2017 [employer] ° °Methotrexate Treatment for an Ectopic Pregnancy, Care After °Refer to this sheet in the next few weeks. These instructions provide you with information on caring for yourself after your procedure. Your health care provider may also give you more specific instructions. Your treatment has been planned according to current medical practices, but problems sometimes occur. Call your health care provider if you have any problems or questions after your procedure. °What can I expect after the procedure? °You may have some abdominal cramping, vaginal bleeding, and fatigue in the first few days after taking methotrexate. Some other possible side effects of methotrexate include: °· Nausea. °· Vomiting. °·   Diarrhea. °· Mouth sores. °· Swelling or irritation of the lining of your lungs (pneumonitis). °· Liver damage. °· Hair loss. °Follow these instructions at home: °After you have received the methotrexate medicine, you need to be careful of your activities and watch your condition for several weeks. It may take 1 week before your hormone levels return to normal. °Activity  °· Do not have sexual intercourse until your health care provider says it is safe to do so. °· You may resume your usual diet. °· Limit strenuous activity. °· Do not drink alcohol. °General instructions  °· Do not take aspirin, ibuprofen, or naproxen (nonsteroidal  anti-inflammatory drugs [NSAIDs]). °· Do not take folic acid, prenatal vitamins, or other vitamins that contain folic acid. °· Avoid traveling too far away from your health care provider. °· Keep all follow-up visits as told by your health care provider. This is important. °Contact a health care provider if: °· You cannot control your nausea and vomiting. °· You cannot control your diarrhea. °· You have sores in your mouth and want treatment. °· You need pain medicine for your abdominal pain. °· You have a rash. °· You are having a reaction to the medicine. °Get help right away if: °· You have increasing abdominal or pelvic pain. °· You notice increased bleeding. °· You feel light-headed, or you faint. °· You have shortness of breath. °· Your heart rate increases. °· You have a cough. °· You have chills. °· You have a fever. °This information is not intended to replace advice given to you by your health care provider. Make sure you discuss any questions you have with your health care provider. °Document Released: 07/24/2011 Document Revised: 01/10/2016 Document Reviewed: 05/23/2013 °Elsevier Interactive Patient Education © 2017 [employer] Inc. ° °

## 2016-10-23 ENCOUNTER — Ambulatory Visit: Payer: Managed Care, Other (non HMO)

## 2016-10-25 ENCOUNTER — Inpatient Hospital Stay (HOSPITAL_COMMUNITY)
Admission: AD | Admit: 2016-10-25 | Discharge: 2016-10-25 | Disposition: A | Discharge status: Home / Self Care | Payer: Managed Care, Other (non HMO) | Source: Ambulatory Visit | Attending: Obstetrics & Gynecology | Admitting: Obstetrics & Gynecology

## 2016-10-25 DIAGNOSIS — O00101 Right tubal pregnancy without intrauterine pregnancy: Secondary | ICD-10-CM

## 2016-10-25 LAB — HCG, QUANTITATIVE, PREGNANCY: HCG, BETA CHAIN, QUANT, S: 6733 m[IU]/mL — AB (ref <5)

## 2016-10-25 NOTE — MAU Note (Signed)
Doing ok.  Continues to have cramping and back pain at times, though none now; much better than before.  Passed a few clots the day she received the injection.

## 2016-10-25 NOTE — MAU Provider Note (Signed)
S:  [name] is a 31 y.o. female [id number] @ [id number]  Here in MAU for Day 4 MTX labs. She was diagnosed with an ectopic pregnancy on 3/7 and received her first dose of MTX. She denies abdominal pain at this time. She has had some vaginal bleeding, however it is unchanged from her previous visit.    O:  GENERAL: Well-developed, well-nourished female in no acute distress.  LUNGS: Effort normal SKIN: Warm, dry and without erythema PSYCH: Normal mood and affect  Vitals:   10/25/16 1237  BP: 112/67  Pulse: 85  Resp: 16  Temp: 98.6 F (37 C)    MDM:  Patient requested to leave and be called with results.  3/7 beta hcg: 4498. 3/10 beta hcg: 6733   A:  1. Right tubal pregnancy without intrauterine pregnancy     P:   Discharge home in stable condition [facility] for stat beta hcg level. Strict return precautions Pelvic rest Ectopic precautions.  Results fully reviewed with the patient over the phone @ 1515.   Duane LopeJennifer I Sharnetta Gielow, NP 10/25/2016 3:15 PM

## 2016-10-28 ENCOUNTER — Telehealth: Payer: Self-pay | Admitting: *Deleted

## 2016-10-28 ENCOUNTER — Ambulatory Visit: Payer: Managed Care, Other (non HMO) | Admitting: *Deleted

## 2016-10-28 ENCOUNTER — Inpatient Hospital Stay (HOSPITAL_COMMUNITY)
Admission: AD | Admit: 2016-10-28 | Discharge: 2016-10-28 | Disposition: A | Discharge status: Home / Self Care | Payer: Managed Care, Other (non HMO) | Source: Ambulatory Visit | Attending: Family Medicine | Admitting: Family Medicine

## 2016-10-28 ENCOUNTER — Encounter: Payer: Self-pay | Admitting: *Deleted

## 2016-10-28 DIAGNOSIS — O00101 Right tubal pregnancy without intrauterine pregnancy: Secondary | ICD-10-CM | POA: Insufficient documentation

## 2016-10-28 DIAGNOSIS — O0000 Abdominal pregnancy without intrauterine pregnancy: Secondary | ICD-10-CM

## 2016-10-28 LAB — CREATININE, SERUM
CREATININE: 0.82 mg/dL (ref 0.44–1.00)
GFR calc Af Amer: >60 mL/min (ref >60)
GFR calc non Af Amer: >60 mL/min (ref >60)

## 2016-10-28 LAB — HCG, QUANTITATIVE, PREGNANCY: hCG, Beta Chain, Quant, S: 5737 m[IU]/mL — ABNORMAL HIGH (ref <5)

## 2016-10-28 LAB — CBC WITH DIFFERENTIAL/PLATELET
Basophils Absolute: 0 10*3/uL (ref 0.0–0.1)
Basophils Relative: 0 %
EOS PCT: 1 %
Eosinophils Absolute: 0.1 10*3/uL (ref 0.0–0.7)
HCT: 37.1 % (ref 36.0–46.0)
Hemoglobin: 12.3 g/dL (ref 12.0–15.0)
LYMPHS ABS: 2 10*3/uL (ref 0.7–4.0)
Lymphocytes Relative: 31 %
MCH: 30.2 pg (ref 26.0–34.0)
MCHC: 33.2 g/dL (ref 30.0–36.0)
MCV: 91.2 fL (ref 78.0–100.0)
MONOS PCT: 6 %
Monocytes Absolute: 0.4 10*3/uL (ref 0.1–1.0)
Neutro Abs: 4 10*3/uL (ref 1.7–7.7)
Neutrophils Relative %: 62 %
PLATELETS: 252 10*3/uL (ref 150–400)
RBC: 4.07 MIL/uL (ref 3.87–5.11)
RDW: 13.7 % (ref 11.5–15.5)
WBC: 6.4 10*3/uL (ref 4.0–10.5)

## 2016-10-28 LAB — AST: AST: 18 U/L (ref 15–41)

## 2016-10-28 LAB — BUN: BUN: 11 mg/dL (ref 6–20)

## 2016-10-28 MED ORDER — METHOTREXATE INJECTION FOR WOMEN'S HOSPITAL
50.0000 mg/m2 | Freq: Once | INTRAMUSCULAR | Status: AC
  Administered 2016-10-28: 100 mg via INTRAMUSCULAR
  Filled 2016-10-28: qty 2

## 2016-10-28 NOTE — MAU Provider Note (Signed)
  History     CSN: [id number]  Arrival date and time: 10/28/16 1722   None     No chief complaint on file.  HPI  31 yo S AA P2 with a known right ectopic pregnancy. She is here for MTX #2 as her QBHCG did not fall enough to avoid the second shot. She is not having any pain, is abstinent currently.  Past Medical History:  Diagnosis Date  . H/O candidiasis   . H/O cystitis    x1    Past Surgical History:  Procedure Laterality Date  . NO PAST SURGERIES      Family History  Problem Relation Age of Onset  . Asthma Maternal Aunt   . Cancer Maternal Aunt     Social History  Substance Use Topics  . Smoking status: Never Smoker  . Smokeless tobacco: Never Used  . Alcohol use No    Allergies: No Known Allergies  Prescriptions Prior to Admission  Medication Sig Dispense Refill Last Dose  . promethazine (PHENERGAN) 25 MG tablet Take 0.5 tablets (12.5 mg total) by mouth every 6 (six) hours as needed for nausea. 20 tablet 0   . promethazine (PHENERGAN) 25 MG tablet Take 1 tablet (25 mg total) by mouth every 6 (six) hours as needed for nausea or vomiting. 30 tablet 0     Review of Systems Physical Exam   Blood pressure 122/72, pulse 81, temperature 98.4 F (36.9 C), temperature source Oral, resp. rate 16, height 5\' 3"  (1.6 m), weight 90.7 kg (200 lb), last menstrual period 09/08/2016.  Physical Exam  Pleasant WNWHBFNAD Breathing, conversing, and ambulating normally Abd- benign  MAU Course  Procedures   Assessment and Plan  Right ectopic pregnancy, stable MTX #2 today [facility] Pain precautions reviewed  Allie BossierMyra C Ysenia Filice 10/28/2016, 7:31 PM

## 2016-10-28 NOTE — Progress Notes (Signed)
Pt in for stat hcg for day 7 mtx. She is feeling good. Denies any pain or bleeding. 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 She can be reached at [phone]. [facility] and she will call her this afternoon after discussing with Dr. Alysia PennaErvin.

## 2016-10-28 NOTE — Telephone Encounter (Signed)
Called patient and relayed bhcg results, Dr Denyce RobertStinson's recommendation to return to MAU for 2nd dose methotrexate. F/U beta in a week. Patient voiced understanding.

## 2016-10-28 NOTE — MAU Note (Signed)
Sent up for 2nd dose of MTX.  Has been doing ok, few back aches yesterday, nothing today.  Small amt of brownish bleeding.

## 2016-10-28 NOTE — Telephone Encounter (Signed)
MAU notified patient coming in.

## 2016-10-28 NOTE — Progress Notes (Signed)
Today's b-hcg relayed to Dr Adrian BlackwaterStinson. He recommends patient return to MAU for second dose methotrexate and f/u with another b-hcg in a week. Will contact patient.

## 2016-10-28 NOTE — Discharge Instructions (Signed)
Ectopic Pregnancy °An ectopic pregnancy happens when a fertilized egg grows outside the uterus. A pregnancy cannot live outside of the uterus. This problem often happens in the fallopian tube. It is often caused by damage to the fallopian tube. °If this problem is found early, you may be treated with medicine. If your tube tears or bursts open (ruptures), you will bleed inside. This is an emergency. You will need surgery. Get help right away. °What are the signs or symptoms? °You may have normal pregnancy symptoms at first. These include: °· Missing your period. °· Feeling sick to your stomach (nauseous). °· Being tired. °· Having tender breasts. ° °Then, you may start to have symptoms that are not normal. These include: °· Pain with sex (intercourse). °· Bleeding from the vagina. This includes light bleeding (spotting). °· Belly (abdomen) or lower belly cramping or pain. This may be felt on one side. °· A fast heartbeat (pulse). °· Passing out (fainting) after going poop (bowel movement). ° °If your tube tears, you may have symptoms such as: °· Really bad pain in the belly or lower belly. This happens suddenly. °· Dizziness. °· Passing out. °· Shoulder pain. ° °Get help right away if: °You have any of these symptoms. This is an emergency. °This information is not intended to replace advice given to you by your health care provider. Make sure you discuss any questions you have with your health care provider. °Document Released: 10/31/2008 Document Revised: 01/10/2016 Document Reviewed: 03/16/2013 °Elsevier Interactive Patient Education © 2017 [employer] Inc. ° °

## 2016-11-04 ENCOUNTER — Ambulatory Visit: Payer: Managed Care, Other (non HMO)

## 2016-11-04 ENCOUNTER — Telehealth: Payer: Self-pay | Admitting: General Practice

## 2016-11-04 DIAGNOSIS — O3680X0 Pregnancy with inconclusive fetal viability, not applicable or unspecified: Secondary | ICD-10-CM

## 2016-11-04 LAB — HCG, QUANTITATIVE, PREGNANCY: HCG, BETA CHAIN, QUANT, S: 1778 m[IU]/mL — AB (ref <5)

## 2016-11-04 NOTE — Telephone Encounter (Signed)
Called patient regarding bhcg results & recommendation for follow up next week. Patient verbalized understanding & states she can be here 3/27 @ 1:30. Patient asked when she can resume intercourse. Told patient to wait at least another week until we have those results back. Patient verbalized understanding & had no questions

## 2016-11-04 NOTE — Progress Notes (Signed)
Pt here today for f/u stat beta quant for tx methotrexate s/p ectopic pregnancy.  Pt denies any complaints.  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  Pt will be contacted based upon results of beta.

## 2016-11-04 NOTE — Progress Notes (Signed)
Spoke with Dr Jolayne Pantheronstant regarding results who recommends follow up bhcg next week. Patient has had favorable drop. Will contact patient with results.

## 2016-11-10 ENCOUNTER — Other Ambulatory Visit (INDEPENDENT_AMBULATORY_CARE_PROVIDER_SITE_OTHER): Payer: Self-pay

## 2016-11-10 DIAGNOSIS — O039 Complete or unspecified spontaneous abortion without complication: Secondary | ICD-10-CM

## 2016-11-11 LAB — BETA HCG QUANT (REF LAB): hCG Quant: 732 m[IU]/mL

## 2016-11-12 ENCOUNTER — Telehealth: Payer: Self-pay | Admitting: *Deleted

## 2016-11-12 NOTE — Telephone Encounter (Signed)
Called pt and heard message stating that the mailbox is full and cannot accept any messages at this time. Pt needs to be informed of BHCG results from 3/27 and need for repeat BHCG next week per Dr. Adrian BlackwaterStinson.

## 2016-11-17 ENCOUNTER — Other Ambulatory Visit: Payer: Managed Care, Other (non HMO)

## 2016-11-17 DIAGNOSIS — O00109 Unspecified tubal pregnancy without intrauterine pregnancy: Secondary | ICD-10-CM

## 2016-11-18 LAB — BETA HCG QUANT (REF LAB): HCG QUANT: 395 m[IU]/mL

## 2016-11-18 NOTE — Telephone Encounter (Signed)
Called patient regarding HCG results. Patient instructed to come in next week for f/u. Patient verbalized understanding with no questions.

## 2016-11-24 ENCOUNTER — Other Ambulatory Visit: Payer: Self-pay | Admitting: Obstetrics & Gynecology

## 2016-11-24 ENCOUNTER — Other Ambulatory Visit: Payer: Self-pay

## 2016-11-24 DIAGNOSIS — O3680X0 Pregnancy with inconclusive fetal viability, not applicable or unspecified: Secondary | ICD-10-CM

## 2016-11-25 LAB — BETA HCG QUANT (REF LAB): hCG Quant: 439 m[IU]/mL

## 2016-11-26 ENCOUNTER — Telehealth: Payer: Self-pay | Admitting: General Practice

## 2016-11-26 NOTE — Telephone Encounter (Signed)
Per Dr Macon Large, patient's bhcg level has plateued & needs repeat next week. If levels continue to rise, that would be concerning given that she has already received two doses of MTX. Called patient and informed her of results & discussed levels with her. Patient verbalized understanding and states she can be here next Monday 4/16 @ 230. Patient had no questions

## 2016-11-27 ENCOUNTER — Telehealth: Payer: Self-pay | Admitting: General Practice

## 2016-11-27 NOTE — Telephone Encounter (Signed)
Patient called into front office voicing concerning over slightly increased bhcg levels and new onset minor cramps and pressure sensation in her vagina. Discussed keeping an eye on how she feels and that if all of the sudden she doesn't feel right, feels like something is wrong, or has severe pain she should go to MAU for further evaluation. Patient verbalized understanding and had no questions

## 2016-12-01 ENCOUNTER — Other Ambulatory Visit: Payer: Managed Care, Other (non HMO)

## 2016-12-01 DIAGNOSIS — O3680X0 Pregnancy with inconclusive fetal viability, not applicable or unspecified: Secondary | ICD-10-CM

## 2016-12-02 LAB — BETA HCG QUANT (REF LAB): hCG Quant: 209 m[IU]/mL

## 2016-12-03 ENCOUNTER — Telehealth: Payer: Self-pay | Admitting: *Deleted

## 2016-12-03 ENCOUNTER — Telehealth: Payer: Self-pay | Admitting: General Practice

## 2016-12-03 NOTE — Telephone Encounter (Signed)
Pt left message for [facility] regarding the FMLA paperwork which was filled out. Her employer told her that the incapacity part was not filled out and the days she missed because of not feeling well would be denied unless this portion is completed. Please call back and let me know what I need to do.

## 2016-12-03 NOTE — Telephone Encounter (Signed)
Called patient regarding bhcg results and need for follow up next week. Patient verbalized understanding and states she can come in 4/23 @ 230. Patient states she has been having pain in her lower belly and back recently since starting antibiotics. Told patient I wasn't certain what could be causing that unless she felt she could possible have a bladder infection or an std or possibly her period starting. Patient verbalized understanding and states she will keep an eye on how she feels. Patient had no questions

## 2016-12-08 ENCOUNTER — Other Ambulatory Visit: Payer: Managed Care, Other (non HMO)

## 2016-12-08 DIAGNOSIS — O00109 Unspecified tubal pregnancy without intrauterine pregnancy: Secondary | ICD-10-CM

## 2016-12-09 LAB — BETA HCG QUANT (REF LAB): hCG Quant: 23 m[IU]/mL

## 2016-12-11 ENCOUNTER — Telehealth: Payer: Self-pay

## 2016-12-11 NOTE — Telephone Encounter (Signed)
Called patient regarding HCG no answer or voicemail to leave a message. Good drop in HCG. Repeat weekly until <5. Please call to inform patient of results and recommendations.

## 2016-12-16 NOTE — Telephone Encounter (Signed)
Called patient and informed her of results and recommended follow up. Patient verbalized understanding and states she actually just started her periods back. Patient states she can come in 5/3 @ 11am. Patient had no questions

## 2016-12-18 ENCOUNTER — Other Ambulatory Visit: Payer: Managed Care, Other (non HMO)

## 2016-12-18 DIAGNOSIS — O3680X0 Pregnancy with inconclusive fetal viability, not applicable or unspecified: Secondary | ICD-10-CM

## 2016-12-19 ENCOUNTER — Other Ambulatory Visit: Payer: Managed Care, Other (non HMO)

## 2016-12-19 LAB — BETA HCG QUANT (REF LAB): hCG Quant: <1 m[IU]/mL

## 2016-12-22 ENCOUNTER — Encounter: Payer: Self-pay | Admitting: Obstetrics & Gynecology

## 2016-12-22 ENCOUNTER — Telehealth: Payer: Self-pay

## 2016-12-22 NOTE — Telephone Encounter (Signed)
Called patient left a message to call us back regarding lab results.

## 2016-12-23 ENCOUNTER — Telehealth: Payer: Self-pay

## 2016-12-23 NOTE — Telephone Encounter (Signed)
Successful treatment of ectopic pregnancy with methotrexate. Needs appointment with provider in 1-2 weeks to discuss contraception, future pregnancy plans etc. 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   Called patient left message for her to call us back regarding test results.

## 2016-12-23 NOTE — Telephone Encounter (Signed)
-----   Message from Tereso NewcomerUgonna A Anyanwu, MD sent at 12/22/2016  9:18 AM EDT ----- Successful treatment of ectopic pregnancy with methotrexate. Needs appointment with provider in 1-2 weeks to discuss contraception, future pregnancy plans etc.  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  Please call to inform patient of results and recommendations.

## 2016-12-23 NOTE — Telephone Encounter (Signed)
Patient has been informed of test results and need to make a follow up appointment in 1-2 weeks with a pap smear. Patient will call back to scheduled appointment.

## 2017-02-04 ENCOUNTER — Telehealth: Payer: Self-pay | Admitting: *Deleted

## 2017-02-04 NOTE — Telephone Encounter (Signed)
I called [place] to clarify paperwork that we have in our office re: disability benefits. She states it can be discarded because it is old paperwork and she has everything straightened out. Paperwork shredded.

## 2019-12-23 ENCOUNTER — Other Ambulatory Visit: Payer: Self-pay

## 2019-12-23 ENCOUNTER — Encounter (HOSPITAL_COMMUNITY): Payer: Self-pay | Admitting: Obstetrics and Gynecology

## 2019-12-23 ENCOUNTER — Inpatient Hospital Stay (HOSPITAL_COMMUNITY)
Admission: AD | Admit: 2019-12-23 | Discharge: 2019-12-23 | Disposition: A | Discharge status: Home / Self Care | Payer: Managed Care, Other (non HMO) | Attending: Obstetrics and Gynecology | Admitting: Obstetrics and Gynecology

## 2019-12-23 ENCOUNTER — Inpatient Hospital Stay (HOSPITAL_COMMUNITY): Payer: Managed Care, Other (non HMO)

## 2019-12-23 DIAGNOSIS — R102 Pelvic and perineal pain: Secondary | ICD-10-CM | POA: Diagnosis not present

## 2019-12-23 DIAGNOSIS — Z3490 Encounter for supervision of normal pregnancy, unspecified, unspecified trimester: Secondary | ICD-10-CM

## 2019-12-23 DIAGNOSIS — O26891 Other specified pregnancy related conditions, first trimester: Secondary | ICD-10-CM | POA: Diagnosis not present

## 2019-12-23 DIAGNOSIS — O99891 Other specified diseases and conditions complicating pregnancy: Secondary | ICD-10-CM | POA: Insufficient documentation

## 2019-12-23 DIAGNOSIS — O26899 Other specified pregnancy related conditions, unspecified trimester: Secondary | ICD-10-CM

## 2019-12-23 DIAGNOSIS — M545 Low back pain, unspecified: Secondary | ICD-10-CM

## 2019-12-23 DIAGNOSIS — R109 Unspecified abdominal pain: Secondary | ICD-10-CM

## 2019-12-23 DIAGNOSIS — Z3A11 11 weeks gestation of pregnancy: Secondary | ICD-10-CM | POA: Insufficient documentation

## 2019-12-23 DIAGNOSIS — Z8759 Personal history of other complications of pregnancy, childbirth and the puerperium: Secondary | ICD-10-CM | POA: Diagnosis not present

## 2019-12-23 LAB — COMPREHENSIVE METABOLIC PANEL
ALT: 20 U/L (ref 0–44)
AST: 18 U/L (ref 15–41)
Albumin: 3.8 g/dL (ref 3.5–5.0)
Alkaline Phosphatase: 59 U/L (ref 38–126)
Anion gap: 14 (ref 5–15)
BUN: 5 mg/dL — ABNORMAL LOW (ref 6–20)
CO2: 20 mmol/L — ABNORMAL LOW (ref 22–32)
Calcium: 9.2 mg/dL (ref 8.9–10.3)
Chloride: 101 mmol/L (ref 98–111)
Creatinine, Ser: 0.63 mg/dL (ref 0.44–1.00)
GFR calc Af Amer: >60 mL/min (ref >60)
GFR calc non Af Amer: >60 mL/min (ref >60)
Glucose, Bld: 88 mg/dL (ref 70–99)
Potassium: 3.5 mmol/L (ref 3.5–5.1)
Sodium: 135 mmol/L (ref 135–145)
Total Bilirubin: 0.7 mg/dL (ref 0.3–1.2)
Total Protein: 7.2 g/dL (ref 6.5–8.1)

## 2019-12-23 LAB — URINALYSIS, ROUTINE W REFLEX MICROSCOPIC
Bilirubin Urine: NEGATIVE
Glucose, UA: NEGATIVE mg/dL
Hgb urine dipstick: NEGATIVE
Ketones, ur: 80 mg/dL — AB
Leukocytes,Ua: NEGATIVE
Nitrite: NEGATIVE
Protein, ur: NEGATIVE mg/dL
Specific Gravity, Urine: 1.019 (ref 1.005–1.030)
pH: 5 (ref 5.0–8.0)

## 2019-12-23 LAB — CBC
HCT: 41 % (ref 36.0–46.0)
Hemoglobin: 13.3 g/dL (ref 12.0–15.0)
MCH: 29.4 pg (ref 26.0–34.0)
MCHC: 32.4 g/dL (ref 30.0–36.0)
MCV: 90.7 fL (ref 80.0–100.0)
Platelets: 250 10*3/uL (ref 150–400)
RBC: 4.52 MIL/uL (ref 3.87–5.11)
RDW: 13.1 % (ref 11.5–15.5)
WBC: 8.6 10*3/uL (ref 4.0–10.5)
nRBC: 0 % (ref 0.0–0.2)

## 2019-12-23 LAB — ABO/RH: ABO/RH(D): O POS

## 2019-12-23 LAB — POCT PREGNANCY, URINE: Preg Test, Ur: POSITIVE — AB

## 2019-12-23 LAB — WET PREP, GENITAL
Clue Cells Wet Prep HPF POC: NONE SEEN
Sperm: NONE SEEN
Trich, Wet Prep: NONE SEEN
Yeast Wet Prep HPF POC: NONE SEEN

## 2019-12-23 LAB — HCG, QUANTITATIVE, PREGNANCY: hCG, Beta Chain, Quant, S: 91453 m[IU]/mL — ABNORMAL HIGH (ref <5)

## 2019-12-23 NOTE — Discharge Instructions (Signed)
[facility] Providers     [facility]     Phone: [phone]  [facility] [place]  Phone: [phone]  [facility] [place]  Phone: [phone]  [facility] [place]                           Phone: [phone]  [facility]          Phone: [phone]  [facility]    Phone: [phone]   [facility] [place])    Phone: [phone]  [facility]    Phone: [phone]  [facility]    Phone: [phone]  [facility]    Phone: [phone]  [facility]  Phone: [phone]  [facility]               Phone: [phone]  [facility]   Phone: [phone]  [facility]    Phone: [phone]                                     Safe Medications in Pregnancy    Acne: Benzoyl Peroxide Salicylic Acid  Backache/Headache: Tylenol: 2 regular strength every 4 hours OR              2 Extra strength every 6 hours  Colds/Coughs/Allergies: Benadryl (alcohol free) 25 mg every 6 hours as needed Breath right strips Claritin Cepacol throat lozenges Chloraseptic throat spray Cold-Eeze- up to three times per day Cough drops, alcohol free Flonase (by prescription only) Guaifenesin Mucinex Robitussin DM (plain only, alcohol free) Saline nasal spray/drops Sudafed (pseudoephedrine) & Actifed ** use only after [redacted] weeks gestation and if you do not have high blood pressure Tylenol Vicks Vaporub Zinc lozenges Zyrtec   Constipation: Colace Ducolax suppositories Fleet enema Glycerin suppositories Metamucil Milk of magnesia Miralax Senokot Smooth move tea  Diarrhea: Kaopectate Imodium A-D  *NO pepto  Bismol  Hemorrhoids: Anusol Anusol HC Preparation H Tucks  Indigestion: Tums Maalox Mylanta Zantac  Pepcid  Insomnia: Benadryl (alcohol free)  every 6 hours as needed Tylenol PM Unisom, no Gelcaps  Leg Cramps: Tums MagGel  Nausea/Vomiting:  Bonine Dramamine Emetrol Ginger extract Sea bands Meclizine  Nausea medication to take during pregnancy:  Unisom (doxylamine succinate 25 mg tablets) Take one tablet daily at bedtime. If symptoms are not adequately controlled, the dose can be increased to a maximum recommended dose of two tablets daily (1/2 tablet in the morning, 1/2 tablet mid-afternoon and one at bedtime). Vitamin B6  tablets. Take one tablet twice a day (up to 200 mg per day).  Skin Rashes: Aveeno products Benadryl cream or  every 6 hours as needed Calamine Lotion 1% cortisone cream  Yeast infection: Gyne-lotrimin 7 Monistat 7   **If taking multiple medications, please check labels to avoid duplicating the same active ingredients **take medication as directed on the label ** Do not exceed 4000 mg of tylenol in 24 hours **Do not take medications that contain aspirin or ibuprofen         Abdominal Pain During Pregnancy  Abdominal pain is common during pregnancy, and has many possible causes. Some causes are more serious than others, and sometimes the cause is not known. Abdominal pain can be  a sign that labor is starting. It can also be caused by normal growth and stretching of muscles and ligaments during pregnancy. Always tell your health care provider if you have any abdominal pain. Follow these instructions at home:  Do not have sex or put anything in your vagina until your pain goes away completely.  Get plenty of rest until your pain improves.  Drink enough fluid to keep your urine pale yellow.  Take over-the-counter and prescription medicines only as told by your health care provider.  Keep all follow-up visits as told by  your health care provider. This is important. Contact a health care provider if:  Your pain continues or gets worse after resting.  You have lower abdominal pain that: ? Comes and goes at regular intervals. ? Spreads to your back. ? Is similar to menstrual cramps.  You have pain or burning when you urinate. Get help right away if:  You have a fever or chills.  You have vaginal bleeding.  You are leaking fluid from your vagina.  You are passing tissue from your vagina.  You have vomiting or diarrhea that lasts for more than 24 hours.  Your baby is moving less than usual.  You feel very weak or faint.  You have shortness of breath.  You develop severe pain in your upper abdomen. Summary  Abdominal pain is common during pregnancy, and has many possible causes.  If you experience abdominal pain during pregnancy, tell your health care provider right away.  Follow your health care provider's home care instructions and keep all follow-up visits as directed. This information is not intended to replace advice given to you by your health care provider. Make sure you discuss any questions you have with your health care provider. Document Revised: 11/22/2018 Document Reviewed: 11/06/2016 Elsevier Patient Education  2020 [employer].        First Trimester of Pregnancy The first trimester of pregnancy is from week 1 until the end of week 13 (months 1 through 3). A week after a sperm fertilizes an egg, the egg will implant on the wall of the uterus. This embryo will begin to develop into a baby. Genes from you and your partner will form the baby. The female genes will determine whether the baby will be a boy or a girl. At 6-8 weeks, the eyes and face will be formed, and the heartbeat can be seen on ultrasound. At the end of 12 weeks, all the baby's organs will be formed. Now that you are pregnant, you will want to do everything you can to have a healthy baby. Two of the most  important things are to get good prenatal care and to follow your health care provider's instructions. Prenatal care is all the medical care you receive before the baby's birth. This care will help prevent, find, and treat any problems during the pregnancy and childbirth. Body changes during your first trimester Your body goes through many changes during pregnancy. The changes vary from woman to woman.  You may gain or lose a couple of pounds at first.  You may feel sick to your stomach (nauseous) and you may throw up (vomit). If the vomiting is uncontrollable, call your health care provider.  You may tire easily.  You may develop headaches that can be relieved by medicines. All medicines should be approved by your health care provider.  You may urinate more often. Painful urination may mean you have a bladder infection.  You may develop heartburn  as a result of your pregnancy.  You may develop constipation because certain hormones are causing the muscles that push stool through your intestines to slow down.  You may develop hemorrhoids or swollen veins (varicose veins).  Your breasts may begin to grow larger and become tender. Your nipples may stick out more, and the tissue that surrounds them (areola) may become darker.  Your gums may bleed and may be sensitive to brushing and flossing.  Dark spots or blotches (chloasma, mask of pregnancy) may develop on your face. This will likely fade after the baby is born.  Your menstrual periods will stop.  You may have a loss of appetite.  You may develop cravings for certain kinds of food.  You may have changes in your emotions from day to day, such as being excited to be pregnant or being concerned that something may go wrong with the pregnancy and baby.  You may have more vivid and strange dreams.  You may have changes in your hair. These can include thickening of your hair, rapid growth, and changes in texture. Some women also have  hair loss during or after pregnancy, or hair that feels dry or thin. Your hair will most likely return to normal after your baby is born. What to expect at prenatal visits During a routine prenatal visit:  You will be weighed to make sure you and the baby are growing normally.  Your blood pressure will be taken.  Your abdomen will be measured to track your baby's growth.  The fetal heartbeat will be listened to between weeks 10 and 14 of your pregnancy.  Test results from any previous visits will be discussed. Your health care provider may ask you:  How you are feeling.  If you are feeling the baby move.  If you have had any abnormal symptoms, such as leaking fluid, bleeding, severe headaches, or abdominal cramping.  If you are using any tobacco products, including cigarettes, chewing tobacco, and electronic cigarettes.  If you have any questions. Other tests that may be performed during your first trimester include:  Blood tests to find your blood type and to check for the presence of any previous infections. The tests will also be used to check for low iron levels (anemia) and protein on red blood cells (Rh antibodies). Depending on your risk factors, or if you previously had diabetes during pregnancy, you may have tests to check for high blood sugar that affects pregnant women (gestational diabetes).  Urine tests to check for infections, diabetes, or protein in the urine.  An ultrasound to confirm the proper growth and development of the baby.  Fetal screens for spinal cord problems (spina bifida) and Down syndrome.  HIV (human immunodeficiency virus) testing. Routine prenatal testing includes screening for HIV, unless you choose not to have this test.  You may need other tests to make sure you and the baby are doing well. Follow these instructions at home: Medicines  Follow your health care provider's instructions regarding medicine use. Specific medicines may be either  safe or unsafe to take during pregnancy.  Take a prenatal vitamin that contains at least 600 micrograms (mcg) of folic acid.  If you develop constipation, try taking a stool softener if your health care provider approves. Eating and drinking   Eat a balanced diet that includes fresh fruits and vegetables, whole grains, good sources of protein such as meat, eggs, or tofu, and low-fat dairy. Your health care provider will help you determine the amount  of weight gain that is right for you.  Avoid raw meat and uncooked cheese. These carry germs that can cause birth defects in the baby.  Eating four or five small meals rather than three large meals a day may help relieve nausea and vomiting. If you start to feel nauseous, eating a few soda crackers can be helpful. Drinking liquids between meals, instead of during meals, also seems to help ease nausea and vomiting.  Limit foods that are high in fat and processed sugars, such as fried and sweet foods.  To prevent constipation: ? Eat foods that are high in fiber, such as fresh fruits and vegetables, whole grains, and beans. ? Drink enough fluid to keep your urine clear or pale yellow. Activity  Exercise only as directed by your health care provider. Most women can continue their usual exercise routine during pregnancy. Try to exercise for 30 minutes at least 5 days a week. Exercising will help you: ? Control your weight. ? Stay in shape. ? Be prepared for labor and delivery.  Experiencing pain or cramping in the lower abdomen or lower back is a good sign that you should stop exercising. Check with your health care provider before continuing with normal exercises.  Try to avoid standing for long periods of time. Move your legs often if you must stand in one place for a long time.  Avoid heavy lifting.  Wear low-heeled shoes and practice good posture.  You may continue to have sex unless your health care provider tells you not to. Relieving  pain and discomfort  Wear a good support bra to relieve breast tenderness.  Take warm sitz baths to soothe any pain or discomfort caused by hemorrhoids. Use hemorrhoid cream if your health care provider approves.  Rest with your legs elevated if you have leg cramps or low back pain.  If you develop varicose veins in your legs, wear support hose. Elevate your feet for 15 minutes, 3-4 times a day. Limit salt in your diet. Prenatal care  Schedule your prenatal visits by the twelfth week of pregnancy. They are usually scheduled monthly at first, then more often in the last 2 months before delivery.  Write down your questions. Take them to your prenatal visits.  Keep all your prenatal visits as told by your health care provider. This is important. Safety  Wear your seat belt at all times when driving.  Make a list of emergency phone numbers, including numbers for family, friends, the hospital, and police and fire departments. General instructions  Ask your health care provider for a referral to a local prenatal education class. Begin classes no later than the beginning of month 6 of your pregnancy.  Ask for help if you have counseling or nutritional needs during pregnancy. Your health care provider can offer advice or refer you to specialists for help with various needs.  Do not use hot tubs, steam rooms, or saunas.  Do not douche or use tampons or scented sanitary pads.  Do not cross your legs for long periods of time.  Avoid cat litter boxes and soil used by cats. These carry germs that can cause birth defects in the baby and possibly loss of the fetus by miscarriage or stillbirth.  Avoid all smoking, herbs, alcohol, and medicines not prescribed by your health care provider. Chemicals in these products affect the formation and growth of the baby.  Do not use any products that contain nicotine or tobacco, such as cigarettes and e-cigarettes. If you  need help quitting, ask your health  care provider. You may receive counseling support and other resources to help you quit.  Schedule a dentist appointment. At home, brush your teeth with a soft toothbrush and be gentle when you floss. Contact a health care provider if:  You have dizziness.  You have mild pelvic cramps, pelvic pressure, or nagging pain in the abdominal area.  You have persistent nausea, vomiting, or diarrhea.  You have a bad smelling vaginal discharge.  You have pain when you urinate.  You notice increased swelling in your face, hands, legs, or ankles.  You are exposed to fifth disease or chickenpox.  You are exposed to [place] measles (rubella) and have never had it. Get help right away if:  You have a fever.  You are leaking fluid from your vagina.  You have spotting or bleeding from your vagina.  You have severe abdominal cramping or pain.  You have rapid weight gain or loss.  You vomit blood or material that looks like coffee grounds.  You develop a severe headache.  You have shortness of breath.  You have any kind of trauma, such as from a fall or a car accident. Summary  The first trimester of pregnancy is from week 1 until the end of week 13 (months 1 through 3).  Your body goes through many changes during pregnancy. The changes vary from woman to woman.  You will have routine prenatal visits. During those visits, your health care provider will examine you, discuss any test results you may have, and talk with you about how you are feeling. This information is not intended to replace advice given to you by your health care provider. Make sure you discuss any questions you have with your health care provider. Document Revised: 07/17/2017 Document Reviewed: 07/16/2016 Elsevier Patient Education  2020 [employer].           Second Trimester of Pregnancy The second trimester is from week 14 through week 27 (months 4 through 6). The second trimester is often a time when you  feel your best. Your body has adjusted to being pregnant, and you begin to feel better physically. Usually, morning sickness has lessened or quit completely, you may have more energy, and you may have an increase in appetite. The second trimester is also a time when the fetus is growing rapidly. At the end of the sixth month, the fetus is about 9 inches long and weighs about 1 pounds. You will likely begin to feel the baby move (quickening) between 16 and 20 weeks of pregnancy. Body changes during your second trimester Your body continues to go through many changes during your second trimester. The changes vary from woman to woman.  Your weight will continue to increase. You will notice your lower abdomen bulging out.  You may begin to get stretch marks on your hips, abdomen, and breasts.  You may develop headaches that can be relieved by medicines. The medicines should be approved by your health care provider.  You may urinate more often because the fetus is pressing on your bladder.  You may develop or continue to have heartburn as a result of your pregnancy.  You may develop constipation because certain hormones are causing the muscles that push waste through your intestines to slow down.  You may develop hemorrhoids or swollen, bulging veins (varicose veins).  You may have back pain. This is caused by: ? Weight gain. ? Pregnancy hormones that are relaxing the joints in  your pelvis. ? A shift in weight and the muscles that support your balance.  Your breasts will continue to grow and they will continue to become tender.  Your gums may bleed and may be sensitive to brushing and flossing.  Dark spots or blotches (chloasma, mask of pregnancy) may develop on your face. This will likely fade after the baby is born.  A dark line from your belly button to the pubic area (linea nigra) may appear. This will likely fade after the baby is born.  You may have changes in your hair. These can  include thickening of your hair, rapid growth, and changes in texture. Some women also have hair loss during or after pregnancy, or hair that feels dry or thin. Your hair will most likely return to normal after your baby is born. What to expect at prenatal visits During a routine prenatal visit:  You will be weighed to make sure you and the fetus are growing normally.  Your blood pressure will be taken.  Your abdomen will be measured to track your baby's growth.  The fetal heartbeat will be listened to.  Any test results from the previous visit will be discussed. Your health care provider may ask you:  How you are feeling.  If you are feeling the baby move.  If you have had any abnormal symptoms, such as leaking fluid, bleeding, severe headaches, or abdominal cramping.  If you are using any tobacco products, including cigarettes, chewing tobacco, and electronic cigarettes.  If you have any questions. Other tests that may be performed during your second trimester include:  Blood tests that check for: ? Low iron levels (anemia). ? High blood sugar that affects pregnant women (gestational diabetes) between 68 and 28 weeks. ? Rh antibodies. This is to check for a protein on red blood cells (Rh factor).  Urine tests to check for infections, diabetes, or protein in the urine.  An ultrasound to confirm the proper growth and development of the baby.  An amniocentesis to check for possible genetic problems.  Fetal screens for spina bifida and Down syndrome.  HIV (human immunodeficiency virus) testing. Routine prenatal testing includes screening for HIV, unless you choose not to have this test. Follow these instructions at home: Medicines  Follow your health care provider's instructions regarding medicine use. Specific medicines may be either safe or unsafe to take during pregnancy.  Take a prenatal vitamin that contains at least 600 micrograms (mcg) of folic acid.  If you  develop constipation, try taking a stool softener if your health care provider approves. Eating and drinking   Eat a balanced diet that includes fresh fruits and vegetables, whole grains, good sources of protein such as meat, eggs, or tofu, and low-fat dairy. Your health care provider will help you determine the amount of weight gain that is right for you.  Avoid raw meat and uncooked cheese. These carry germs that can cause birth defects in the baby.  If you have low calcium intake from food, talk to your health care provider about whether you should take a daily calcium supplement.  Limit foods that are high in fat and processed sugars, such as fried and sweet foods.  To prevent constipation: ? Drink enough fluid to keep your urine clear or pale yellow. ? Eat foods that are high in fiber, such as fresh fruits and vegetables, whole grains, and beans. Activity  Exercise only as directed by your health care provider. Most women can continue their usual exercise  routine during pregnancy. Try to exercise for 30 minutes at least 5 days a week. Stop exercising if you experience uterine contractions.  Avoid heavy lifting, wear low heel shoes, and practice good posture.  A sexual relationship may be continued unless your health care provider directs you otherwise. Relieving pain and discomfort  Wear a good support bra to prevent discomfort from breast tenderness.  Take warm sitz baths to soothe any pain or discomfort caused by hemorrhoids. Use hemorrhoid cream if your health care provider approves.  Rest with your legs elevated if you have leg cramps or low back pain.  If you develop varicose veins, wear support hose. Elevate your feet for 15 minutes, 3-4 times a day. Limit salt in your diet. Prenatal Care  Write down your questions. Take them to your prenatal visits.  Keep all your prenatal visits as told by your health care provider. This is important. Safety  Wear your seat belt at  all times when driving.  Make a list of emergency phone numbers, including numbers for family, friends, the hospital, and police and fire departments. General instructions  Ask your health care provider for a referral to a local prenatal education class. Begin classes no later than the beginning of month 6 of your pregnancy.  Ask for help if you have counseling or nutritional needs during pregnancy. Your health care provider can offer advice or refer you to specialists for help with various needs.  Do not use hot tubs, steam rooms, or saunas.  Do not douche or use tampons or scented sanitary pads.  Do not cross your legs for long periods of time.  Avoid cat litter boxes and soil used by cats. These carry germs that can cause birth defects in the baby and possibly loss of the fetus by miscarriage or stillbirth.  Avoid all smoking, herbs, alcohol, and unprescribed drugs. Chemicals in these products can affect the formation and growth of the baby.  Do not use any products that contain nicotine or tobacco, such as cigarettes and e-cigarettes. If you need help quitting, ask your health care provider.  Visit your dentist if you have not gone yet during your pregnancy. Use a soft toothbrush to brush your teeth and be gentle when you floss. Contact a health care provider if:  You have dizziness.  You have mild pelvic cramps, pelvic pressure, or nagging pain in the abdominal area.  You have persistent nausea, vomiting, or diarrhea.  You have a bad smelling vaginal discharge.  You have pain when you urinate. Get help right away if:  You have a fever.  You are leaking fluid from your vagina.  You have spotting or bleeding from your vagina.  You have severe abdominal cramping or pain.  You have rapid weight gain or weight loss.  You have shortness of breath with chest pain.  You notice sudden or extreme swelling of your face, hands, ankles, feet, or legs.  You have not felt your  baby move in over an hour.  You have severe headaches that do not go away when you take medicine.  You have vision changes. Summary  The second trimester is from week 14 through week 27 (months 4 through 6). It is also a time when the fetus is growing rapidly.  Your body goes through many changes during pregnancy. The changes vary from woman to woman.  Avoid all smoking, herbs, alcohol, and unprescribed drugs. These chemicals affect the formation and growth your baby.  Do not use any  tobacco products, such as cigarettes, chewing tobacco, and e-cigarettes. If you need help quitting, ask your health care provider.  Contact your health care provider if you have any questions. Keep all prenatal visits as told by your health care provider. This is important. This information is not intended to replace advice given to you by your health care provider. Make sure you discuss any questions you have with your health care provider. Document Revised: 11/26/2018 Document Reviewed: 09/09/2016 Elsevier Patient Education  2020 [employer].

## 2019-12-23 NOTE — MAU Provider Note (Signed)
History     CSN: [id number]  Arrival date and time: 12/23/19 1412   First Provider Initiated Contact with Patient 12/23/19 1802      Chief Complaint  Patient presents with  . Abdominal Pain  . Back Pain  . Possible Pregnancy   Ms. Jo Graham is a 34 y.o. [phone] at [redacted]w[redacted]d who presents to MAU for cramping and low back pain, but patient reports "it is not that bad."  Onset: one week ago Location: low back, mid-line pelvis Duration: one week Character: menstrual-like cramping, mild, intermittent Aggravating/Associated: moving, lying on back/none Relieving: lying on side Treatment: none Severity: 5/10  Pt denies VB, vaginal discharge/odor/itching. Pt denies N/V, abdominal pain, constipation, diarrhea, or urinary problems. Pt denies fever, chills, fatigue, sweating or changes in appetite. Pt denies SOB or chest pain. Pt denies dizziness, HA, light-headedness, weakness.  Problems this pregnancy include: pt has not yet been seen. Allergies? NKDA Current medications/supplements? PNVs Prenatal care provider? Patient requests list of OB providers   OB History    Gravida  4   Para  2   Term  1   Preterm  1   AB  1   Living  2     SAB      TAB      Ectopic  1   Multiple      Live Births  2           Past Medical History:  Diagnosis Date  . H/O candidiasis   . H/O cystitis    x1    Past Surgical History:  Procedure Laterality Date  . NO PAST SURGERIES      Family History  Problem Relation Age of Onset  . Asthma Maternal Aunt   . Cancer Maternal Aunt     Social History   Tobacco Use  . Smoking status: Never Smoker  . Smokeless tobacco: Never Used  Substance Use Topics  . Alcohol use: No  . Drug use: No    Allergies: No Known Allergies  Medications Prior to Admission  Medication Sig Dispense Refill Last Dose  . Prenatal Vit-Fe Fumarate-FA (MULTIVITAMIN-PRENATAL) 27-0.8 MG TABS tablet Take 1 tablet by mouth daily at 12 noon.    Past Week at Unknown time  . promethazine (PHENERGAN) 25 MG tablet Take 0.5 tablets (12.5 mg total) by mouth every 6 (six) hours as needed for nausea. 20 tablet 0   . promethazine (PHENERGAN) 25 MG tablet Take 1 tablet (25 mg total) by mouth every 6 (six) hours as needed for nausea or vomiting. (Patient not taking: Reported on 12/23/2019) 30 tablet 0 Not Taking at Unknown time    Review of Systems  Constitutional: Negative for chills, diaphoresis, fatigue and fever.  Eyes: Negative for visual disturbance.  Respiratory: Negative for shortness of breath.   Cardiovascular: Negative for chest pain.  Gastrointestinal: Negative for abdominal pain, constipation, diarrhea, nausea and vomiting.  Genitourinary: Negative for dysuria, flank pain, frequency, pelvic pain, urgency, vaginal bleeding and vaginal discharge.  Neurological: Negative for dizziness, weakness, light-headedness and headaches.   Physical Exam   Blood pressure 137/81, pulse 94, temperature 99.3 F (37.4 C), temperature source Oral, resp. rate 18, height 5\' 1"  (1.549 m), weight 101 kg, last menstrual period 10/03/2019, SpO2 100 %, unknown if currently breastfeeding.  Patient Vitals for the past 24 hrs:  BP Temp Temp src Pulse Resp SpO2 Height Weight  12/23/19 1427 137/81 99.3 F (37.4 C) Oral 94 18 100 % 5\' 1"  (1.549  m) 101 kg   Physical Exam  Constitutional: She is oriented to person, place, and time. She appears well-developed and well-nourished. No distress.  HENT:  Head: Normocephalic and atraumatic.  Respiratory: Effort normal.  GI: Soft. She exhibits no distension and no mass. There is no abdominal tenderness. There is no rebound, no guarding and no CVA tenderness.  Musculoskeletal:     Lumbar back: No swelling, edema, deformity, pain or tenderness.  Neurological: She is alert and oriented to person, place, and time.  Skin: Skin is warm and dry. She is not diaphoretic.  Psychiatric: She has a normal mood and affect. Her  behavior is normal. Judgment and thought content normal.   Results for orders placed or performed during the hospital encounter of 12/23/19 (from the past 24 hour(s))  Pregnancy, urine POC     Status: Abnormal   Collection Time: 12/23/19  2:52 PM  Result Value Ref Range   Preg Test, Ur POSITIVE (A) NEGATIVE  Urinalysis, Routine w reflex microscopic     Status: Abnormal   Collection Time: 12/23/19  3:00 PM  Result Value Ref Range   Color, Urine YELLOW YELLOW   APPearance CLEAR CLEAR   Specific Gravity, Urine 1.019 1.005 - 1.030   pH 5.0 5.0 - 8.0   Glucose, UA NEGATIVE NEGATIVE mg/dL   Hgb urine dipstick NEGATIVE NEGATIVE   Bilirubin Urine NEGATIVE NEGATIVE   Ketones, ur 80 (A) NEGATIVE mg/dL   Protein, ur NEGATIVE NEGATIVE mg/dL   Nitrite NEGATIVE NEGATIVE   Leukocytes,Ua NEGATIVE NEGATIVE  CBC     Status: None   Collection Time: 12/23/19  6:06 PM  Result Value Ref Range   WBC 8.6 4.0 - 10.5 K/uL   RBC 4.52 3.87 - 5.11 MIL/uL   Hemoglobin 13.3 12.0 - 15.0 g/dL   HCT 44.3 15.4 - 00.8 %   MCV 90.7 80.0 - 100.0 fL   MCH 29.4 26.0 - 34.0 pg   MCHC 32.4 30.0 - 36.0 g/dL   RDW 67.6 19.5 - 09.3 %   Platelets 250 150 - 400 K/uL   nRBC 0.0 0.0 - 0.2 %  Comprehensive metabolic panel     Status: Abnormal   Collection Time: 12/23/19  6:06 PM  Result Value Ref Range   Sodium 135 135 - 145 mmol/L   Potassium 3.5 3.5 - 5.1 mmol/L   Chloride 101 98 - 111 mmol/L   CO2 20 (L) 22 - 32 mmol/L   Glucose, Bld 88 70 - 99 mg/dL   BUN 5 (L) 6 - 20 mg/dL   Creatinine, Ser 2.67 0.44 - 1.00 mg/dL   Calcium 9.2 8.9 - 12.4 mg/dL   Total Protein 7.2 6.5 - 8.1 g/dL   Albumin 3.8 3.5 - 5.0 g/dL   AST 18 15 - 41 U/L   ALT 20 0 - 44 U/L   Alkaline Phosphatase 59 38 - 126 U/L   Total Bilirubin 0.7 0.3 - 1.2 mg/dL   GFR calc non Af Amer >60 >60 mL/min   GFR calc Af Amer >60 >60 mL/min   Anion gap 14 5 - 15  hCG, quantitative, pregnancy     Status: Abnormal   Collection Time: 12/23/19  6:06 PM   Result Value Ref Range   hCG, Beta Chain, Quant, S 91,453 (H) <5 mIU/mL  ABO/Rh     Status: None   Collection Time: 12/23/19  6:06 PM  Result Value Ref Range   ABO/RH(D) O POS    No rh  immune globuloin      NOT A RH IMMUNE GLOBULIN CANDIDATE, PT RH POSITIVE Performed at [facility], [address], [place], [place] [address]   Wet prep, genital     Status: Abnormal   Collection Time: 12/23/19  6:31 PM  Result Value Ref Range   Yeast Wet Prep HPF POC NONE SEEN NONE SEEN   Trich, Wet Prep NONE SEEN NONE SEEN   Clue Cells Wet Prep HPF POC NONE SEEN NONE SEEN   WBC, Wet Prep HPF POC FEW (A) NONE SEEN   Sperm NONE SEEN    US OB Comp Less 14 Wks  Result Date: 12/23/2019 CLINICAL DATA:  Pain. Pregnant patient. History of an ectopic pregnancy. Patient is 11 weeks and 4 days pregnant based on her last menstrual period. EXAM: OBSTETRIC <14 WK ULTRASOUND TECHNIQUE: Transabdominal ultrasound was performed for evaluation of the gestation as well as the maternal uterus and adnexal regions. COMPARISON:  None. FINDINGS: Intrauterine gestational sac: Single Yolk sac:  Visualized. Embryo:  Visualized. Cardiac Activity: Visualized. Heart Rate: 183 bpm CRL:   48.9 mm   11 w 4 d                  [place] EDC: 07/09/2020 Subchorionic hemorrhage:  None visualized. Maternal uterus/adnexae: No uterine masses. Normal cervix. Normal ovaries. No adnexal masses. No pelvic free fluid. IMPRESSION: 1. Single live intrauterine pregnancy with a measured gestational age of [redacted] weeks and 4 days, consistent with the expected gestational age based on the last menstrual period. 2. No pregnancy complication. Normal ovaries and adnexa. No findings to account for pain. Electronically Signed   By: Amie Portland M.D.   On: 12/23/2019 19:08    MAU Course  Procedures  MDM -r/o ectopic -UA: 80 ketones, otherwise WNL, sending urine for culture based on symptoms -CBC: WNL -CMP: no abnormalities requiring treatment -[place]: single IUP,  [redacted]w[redacted]d, FHR 183 -hCG: 91,493 -ABO: O POsitive -WetPrep: WNL -GC/CT collected -pt discharged to home in stable condition  Orders Placed This Encounter  Procedures  . Wet prep, genital    Standing Status:   Standing    Number of Occurrences:   1  . Culture, OB Urine    Standing Status:   Standing    Number of Occurrences:   1  . US OB Comp Less 14 Wks    Standing Status:   Standing    Number of Occurrences:   1    Order Specific Question:   Symptom/Reason for Exam    Answer:   Pelvic pain in pregnancy [329924]    Order Specific Question:   Symptom/Reason for Exam    Answer:   History of ectopic pregnancy [268341]  . Urinalysis, Routine w reflex microscopic    Standing Status:   Standing    Number of Occurrences:   1  . CBC    Standing Status:   Standing    Number of Occurrences:   1  . Comprehensive metabolic panel    Standing Status:   Standing    Number of Occurrences:   1  . hCG, quantitative, pregnancy    Standing Status:   Standing    Number of Occurrences:   1  . Pregnancy, urine POC    Standing Status:   Standing    Number of Occurrences:   1  . ABO/Rh    Standing Status:   Standing    Number of Occurrences:   1  . Discharge  patient    Order Specific Question:   Discharge disposition    Answer:   01-Home or Self Care [1]    Order Specific Question:   Discharge patient date    Answer:   12/23/2019   No orders of the defined types were placed in this encounter.   Assessment and Plan   1. Intrauterine pregnancy   2. Pelvic pain in pregnancy   3. History of ectopic pregnancy   4. [redacted] weeks gestation of pregnancy   5. Bilateral low back pain without sciatica, unspecified chronicity    Allergies as of 12/23/2019   No Known Allergies     Medication List    TAKE these medications   multivitamin-prenatal 27-0.8 MG Tabs tablet Take 1 tablet by mouth daily at 12 noon.   promethazine 25 MG tablet Commonly known as: PHENERGAN Take 0.5 tablets (12.5 mg total) by  mouth every 6 (six) hours as needed for nausea.   promethazine 25 MG tablet Commonly known as: PHENERGAN Take 1 tablet (25 mg total) by mouth every 6 (six) hours as needed for nausea or vomiting.      -will call with cultures results, if positive -pt to start OB care -list of OB providers given -list of safe meds in pregnancy given -return MAU precautions given -pt discharged to home in stable condition  Elmyra Ricks E Annel Zunker 12/23/2019, 8:10 PM

## 2019-12-23 NOTE — MAU Note (Signed)
Found out preg (HPT) a few wks ago.  Hx of ectopic preg(MTX). Been having some back pain and cramping, so thought she should make sure everything is ok.

## 2019-12-25 LAB — CULTURE, OB URINE: Culture: <10000 — AB

## 2019-12-26 LAB — GC/CHLAMYDIA PROBE AMP (~~LOC~~) NOT AT ARMC
Chlamydia: NEGATIVE
Comment: NEGATIVE
Comment: NORMAL
Neisseria Gonorrhea: NEGATIVE

## 2020-01-23 ENCOUNTER — Inpatient Hospital Stay (HOSPITAL_BASED_OUTPATIENT_CLINIC_OR_DEPARTMENT_OTHER): Payer: Managed Care, Other (non HMO)

## 2020-01-23 ENCOUNTER — Encounter (HOSPITAL_COMMUNITY): Payer: Self-pay | Admitting: Obstetrics and Gynecology

## 2020-01-23 ENCOUNTER — Other Ambulatory Visit: Payer: Self-pay

## 2020-01-23 ENCOUNTER — Inpatient Hospital Stay (HOSPITAL_COMMUNITY)
Admission: AD | Admit: 2020-01-23 | Discharge: 2020-01-23 | Disposition: A | Discharge status: Home / Self Care | Payer: Managed Care, Other (non HMO) | Attending: Obstetrics and Gynecology | Admitting: Obstetrics and Gynecology

## 2020-01-23 DIAGNOSIS — O4692 Antepartum hemorrhage, unspecified, second trimester: Secondary | ICD-10-CM

## 2020-01-23 DIAGNOSIS — O034 Incomplete spontaneous abortion without complication: Secondary | ICD-10-CM

## 2020-01-23 DIAGNOSIS — Z3A16 16 weeks gestation of pregnancy: Secondary | ICD-10-CM

## 2020-01-23 DIAGNOSIS — Z3A12 12 weeks gestation of pregnancy: Secondary | ICD-10-CM | POA: Diagnosis not present

## 2020-01-23 DIAGNOSIS — Z79899 Other long term (current) drug therapy: Secondary | ICD-10-CM | POA: Diagnosis not present

## 2020-01-23 DIAGNOSIS — O368390 Maternal care for abnormalities of the fetal heart rate or rhythm, unspecified trimester, not applicable or unspecified: Secondary | ICD-10-CM

## 2020-01-23 LAB — URINALYSIS, ROUTINE W REFLEX MICROSCOPIC
Bilirubin Urine: NEGATIVE
Glucose, UA: NEGATIVE mg/dL
Ketones, ur: NEGATIVE mg/dL
Nitrite: NEGATIVE
Protein, ur: 30 mg/dL — AB
Specific Gravity, Urine: 1.024 (ref 1.005–1.030)
pH: 6 (ref 5.0–8.0)

## 2020-01-23 NOTE — MAU Provider Note (Signed)
History     CSN: [id number]  Arrival date and time: 01/23/20 3532   First Provider Initiated Contact with Patient 01/23/20 1050      Chief Complaint  Patient presents with  . Vaginal Bleeding  . Abdominal Pain   HPI   Ms. Jo Graham is a 34 year old female G4, P1 at 16 weeks here with vaginal spotting that started today.  She noticed a small amount in her underwear however predominantly noticed it only when she wiped.  She has a history of 1 ectopic and 2 previous deliveries.  She has had no pain.   OB History    Gravida  4   Para  2   Term  1   Preterm  1   AB  1   Living  2     SAB      TAB      Ectopic  1   Multiple      Live Births  2           Past Medical History:  Diagnosis Date  . H/O candidiasis   . H/O cystitis    x1    Past Surgical History:  Procedure Laterality Date  . NO PAST SURGERIES      Family History  Problem Relation Age of Onset  . Asthma Maternal Aunt   . Cancer Maternal Aunt     Social History   Tobacco Use  . Smoking status: Never Smoker  . Smokeless tobacco: Never Used  Substance Use Topics  . Alcohol use: No  . Drug use: No    Allergies: No Known Allergies  Medications Prior to Admission  Medication Sig Dispense Refill Last Dose  . acetaminophen (TYLENOL) 325 MG tablet Take 650 mg by mouth every 6 (six) hours as needed.   Past Week at Unknown time  . amoxicillin (MOXATAG) 775 MG 24 hr tablet Take 775 mg by mouth daily.   Past Week at Unknown time  . Prenatal Vit-Fe Fumarate-FA (MULTIVITAMIN-PRENATAL) 27-0.8 MG TABS tablet Take 1 tablet by mouth daily at 12 noon.   Past Week at Unknown time  . promethazine (PHENERGAN) 25 MG tablet Take 0.5 tablets (12.5 mg total) by mouth every 6 (six) hours as needed for nausea. 20 tablet 0   . promethazine (PHENERGAN) 25 MG tablet Take 1 tablet (25 mg total) by mouth every 6 (six) hours as needed for nausea or vomiting. (Patient not taking: Reported on 12/23/2019) 30 tablet 0     Results for orders placed or performed during the hospital encounter of 01/23/20 (from the past 48 hour(s))  Urinalysis, Routine w reflex microscopic     Status: Abnormal   Collection Time: 01/23/20 12:27 PM  Result Value Ref Range   Color, Urine YELLOW YELLOW   APPearance HAZY (A) CLEAR   Specific Gravity, Urine 1.024 1.005 - 1.030   pH 6.0 5.0 - 8.0   Glucose, UA NEGATIVE NEGATIVE mg/dL   Hgb urine dipstick MODERATE (A) NEGATIVE   Bilirubin Urine NEGATIVE NEGATIVE   Ketones, ur NEGATIVE NEGATIVE mg/dL   Protein, ur 30 (A) NEGATIVE mg/dL   Nitrite NEGATIVE NEGATIVE   Leukocytes,Ua SMALL (A) NEGATIVE   RBC / HPF 0-5 0 - 5 RBC/hpf   WBC, UA 0-5 0 - 5 WBC/hpf   Bacteria, UA RARE (A) NONE SEEN   Squamous Epithelial / LPF 6-10 0 - 5   Mucus PRESENT     Comment: Performed at [facility], [address],  [place], [place] [address]   [place] MFM OB LIMITED  Result Date: 01/23/2020 ----------------------------------------------------------------------  OBSTETRICS REPORT                       (Signed Final 01/23/2020 01:11 pm) ---------------------------------------------------------------------- Patient Info  ID #:       [id number]                          D.O.B.:  1986-01-26 (33 yrs)  Name:       Jo Graham [facility]                 Visit Date: 01/23/2020 11:55 am ---------------------------------------------------------------------- Performed By  Attending:        Noralee Space MD        Referred By:      [facility] MAU/Triage  Performed By:     Hurman Horn          [facility] ---------------------------------------------------------------------- Orders  #  Description                           Code        Ordered By  1  [place] MFM OB LIMITED                     09233.00    Attallah Ontko ----------------------------------------------------------------------  #  Order #                     Accession #                 Episode #  1  [id number]                   [id number]                 [id number] ---------------------------------------------------------------------- Indications  [redacted] weeks gestation of pregnancy                Z3A.16  Vaginal bleeding in pregnancy, second          O46.92  trimester  Unable to hear fetal heart tones as reason     O76  for ultrasound ---------------------------------------------------------------------- Fetal Evaluation  Num Of Fetuses:         1  Cardiac Activity:       Absent  Presentation:           Cephalic  Placenta:               Posterior  Amniotic Fluid  AFI FV:      Within normal limits ---------------------------------------------------------------------- Biometry  CRL:      57.4  mm     G. Age:  12w 1d                  EDD:   08/05/20 ---------------------------------------------------------------------- OB History  Gravidity:    4         Term:   1        Prem:  1  Ectopic:      1 ---------------------------------------------------------------------- Gestational Age  LMP:           16w 0d        Date:  10/03/19                 EDD:   07/09/20  Best:          Martyn Ehrich 0d     Det. By:  LMP  (10/03/19)          EDD:   07/09/20 ---------------------------------------------------------------------- Cervix Uterus Adnexa  Cervix  Appears closed, without funnelling.  Uterus  No abnormality visualized.  Right Ovary  Within normal limits.  Left Ovary  Within normal limits.  Adnexa  No abnormality visualized. ---------------------------------------------------------------------- Impression  Patient is being evaluated for c/o vaginal bleeding. She is 29-  weeks' pregnant by her sure LMP dates.  A limited ultrasound study was performed. Amniotic fluid is  normal. The CRL measurement is consistent with 12w 1d  gestation. Unfortunately, no fetal heart activity is seen.  Impression: Fetal Demise.  I discussed with MAU team. ----------------------------------------------------------------------                   Tama High, MD Electronically Signed Final Report   01/23/2020 01:11 pm ----------------------------------------------------------------------    Review of Systems  Constitutional: Negative for fever.  Gastrointestinal: Negative for abdominal pain.  Genitourinary: Positive for vaginal bleeding.   Physical Exam   Blood pressure 135/72, pulse 96, temperature 99.4 F (37.4 C), temperature source Oral, resp. rate 18, height 5\' 1"  (1.549 m), weight 100.2 kg, last menstrual period 10/03/2019, SpO2 99 %, unknown if currently breastfeeding.  Physical Exam  Constitutional: She is oriented to person, place, and time. She appears well-developed and well-nourished. No distress.  HENT:  Head: Normocephalic.  GI: Soft. She exhibits no distension. There is no abdominal tenderness. There is no rebound.  Genitourinary:    Genitourinary Comments: Dilation: Closed Exam by:: Noni Saupe, NP   Neurological: She is alert and oriented to person, place, and time.  Skin: Skin is warm. She is not diaphoretic.  Psychiatric: Her behavior is normal.   MAU Course  Procedures   Pt informed that the ultrasound is considered a limited OB ultrasound and is not intended to be a complete ultrasound exam.  Patient also informed that the ultrasound is not being completed with the intent of assessing for fetal or placental anomalies or any pelvic abnormalities.  Explained that the purpose of today's ultrasound is to assess for  viability.  Patient acknowledges the purpose of the exam and the limitations of the study.   No heart tones noted on BS [place].    MDM  O positive blood type Official US obtained.  Discussed results with patient in detail.    Assessment and Plan   A:  1. Incomplete miscarriage   2. Unable to hear fetal heart tones as reason for ultrasound scan   3. [redacted] weeks gestation of pregnancy     P:  Discharge home in stable condition.  D&C Scheduled for 6/9 Support given O positive blood  type    Noni Saupe I, NP 01/23/2020 7:02 PM

## 2020-01-23 NOTE — Discharge Instructions (Signed)
Dilation and Curettage or Vacuum Curettage  Dilation and curettage (D&C) and vacuum curettage are minor procedures. A D&C involves stretching (dilation) the cervix and scraping (curettage) the inside lining of the uterus (endometrium). During a D&C, tissue is gently scraped from the endometrium, starting from the top portion of the uterus down to the lowest part of the uterus (cervix). During a vacuum curettage, the lining and tissue in the uterus are removed with the use of gentle suction. Curettage may be performed to either diagnose or treat a problem. As a diagnostic procedure, curettage is performed to examine tissues from the uterus. A diagnostic curettage may be done if you have:  Irregular bleeding in the uterus.  Bleeding with the development of clots.  Spotting between menstrual periods.  Prolonged menstrual periods or other abnormal bleeding.  Bleeding after menopause.  No menstrual period (amenorrhea).  A change in size and shape of the uterus.  Abnormal endometrial cells discovered during a Pap test. As a treatment procedure, curettage may be performed for the following reasons:  Removal of an IUD (intrauterine device).  Removal of retained placenta after giving birth.  Abortion.  Miscarriage.  Removal of endometrial polyps.  Removal of uncommon types of noncancerous lumps (fibroids). Tell a health care provider about:  Any allergies you have, including allergies to prescribed medicine or latex.  All medicines you are taking, including vitamins, herbs, eye drops, creams, and over-the-counter medicines. This is especially important if you take any blood-thinning medicine. Bring a list of all of your medicines to your appointment.  Any problems you or family members have had with anesthetic medicines.  Any blood disorders you have.  Any surgeries you have had.  Your medical history and any medical conditions you have.  Whether you are pregnant or may be  pregnant.  Recent vaginal infections you have had.  Recent menstrual periods, bleeding problems you have had, and what form of birth control (contraception) you use. What are the risks? Generally, this is a safe procedure. However, problems may occur, including:  Infection.  Heavy vaginal bleeding.  Allergic reactions to medicines.  Damage to the cervix or other structures or organs.  Development of scar tissue (adhesions) inside the uterus, which can cause abnormal amounts of menstrual bleeding. This may make it harder to get pregnant in the future.  A hole (perforation) or puncture in the uterine wall. This is rare. What happens before the procedure? Staying hydrated Follow instructions from your health care provider about hydration, which may include:  Up to 2 hours before the procedure - you may continue to drink clear liquids, such as water, clear fruit juice, black coffee, and plain tea. Eating and drinking restrictions Follow instructions from your health care provider about eating and drinking, which may include:  8 hours before the procedure - stop eating heavy meals or foods such as meat, fried foods, or fatty foods.  6 hours before the procedure - stop eating light meals or foods, such as toast or cereal.  6 hours before the procedure - stop drinking milk or drinks that contain milk.  2 hours before the procedure - stop drinking clear liquids. If your health care provider told you to take your medicine(s) on the day of your procedure, take them with only a sip of water. Medicines  Ask your health care provider about: ? Changing or stopping your regular medicines. This is especially important if you are taking diabetes medicines or blood thinners. ? Taking medicines such as aspirin   and ibuprofen. These medicines can thin your blood. Do not take these medicines before your procedure if your health care provider instructs you not to.  You may be given antibiotic  medicine to help prevent infection. General instructions  For 24 hours before your procedure, do not: ? Douche. ? Use tampons. ? Use medicines, creams, or suppositories in the vagina. ? Have sexual intercourse.  You may be given a pregnancy test on the day of the procedure.  Plan to have someone take you home from the hospital or clinic.  You may have a blood or urine sample taken.  If you will be going home right after the procedure, plan to have someone with you for 24 hours. What happens during the procedure?  To reduce your risk of infection: ? Your health care team will wash or sanitize their hands. ? Your skin will be washed with soap.  An IV tube will be inserted into one of your veins.  You will be given one of the following: ? A medicine that numbs the area in and around the cervix (local anesthetic). ? A medicine to make you fall asleep (general anesthetic).  You will lie down on your back, with your feet in foot rests (stirrups).  The size and position of your uterus will be checked.  A lubricated instrument (speculum or Sims retractor) will be inserted into the back side of your vagina. The speculum will be used to hold apart the walls of your vagina so your health care provider can see your cervix.  A tool (tenaculum) will be attached to the lip of the cervix to stabilize it.  Your cervix will be softened and dilated. This may be done by: ? Taking a medicine. ? Having tapered dilators or thin rods (laminaria) or gradual widening instruments (tapered dilators) inserted into your cervix.  A small, sharp, curved instrument (curette) will be used to scrape a small amount of tissue or cells from the endometrium or cervical canal. In some cases, gentle suction is applied with the curette. The curette will then be removed. The cells will be taken to a lab for testing. The procedure may vary among health care providers and hospitals. What happens after the  procedure?  You may have mild cramping, backache, pain, and light bleeding or spotting. You may pass small blood clots from your vagina.  You may have to wear compression stockings. These stockings help to prevent blood clots and reduce swelling in your legs.  Your blood pressure, heart rate, breathing rate, and blood oxygen level will be monitored until the medicines you were given have worn off. Summary  Dilation and curettage (D&C) involves stretching (dilation) the cervix and scraping (curettage) the inside lining of the uterus (endometrium).  After the procedure, you may have mild cramping, backache, pain, and light bleeding or spotting. You may pass small blood clots from your vagina.  Plan to have someone take you home from the hospital or clinic. This information is not intended to replace advice given to you by your health care provider. Make sure you discuss any questions you have with your health care provider. Document Revised: 07/17/2017 Document Reviewed: 04/20/2016 Elsevier Patient Education  2020 [employer] Inc[employer]  

## 2020-01-23 NOTE — MAU Note (Signed)
Pinkish spotting, started last night.  No prior hx of bleeding.  Little cramps.

## 2020-01-24 ENCOUNTER — Other Ambulatory Visit: Payer: Self-pay | Admitting: Obstetrics and Gynecology

## 2020-01-24 ENCOUNTER — Encounter (HOSPITAL_COMMUNITY): Payer: Self-pay | Admitting: Family Medicine

## 2020-01-24 ENCOUNTER — Inpatient Hospital Stay (HOSPITAL_COMMUNITY): Payer: Managed Care, Other (non HMO)

## 2020-01-24 ENCOUNTER — Other Ambulatory Visit (HOSPITAL_COMMUNITY)
Admission: RE | Admit: 2020-01-24 | Discharge: 2020-01-24 | Disposition: A | Discharge status: Home / Self Care | Payer: Managed Care, Other (non HMO) | Source: Ambulatory Visit | Attending: Obstetrics and Gynecology | Admitting: Obstetrics and Gynecology

## 2020-01-24 ENCOUNTER — Inpatient Hospital Stay (EMERGENCY_DEPARTMENT_HOSPITAL)
Admission: AD | Admit: 2020-01-24 | Discharge: 2020-01-25 | Disposition: A | Discharge status: Home / Self Care | Payer: Managed Care, Other (non HMO) | Source: Home / Self Care | Attending: Family Medicine | Admitting: Family Medicine

## 2020-01-24 DIAGNOSIS — O021 Missed abortion: Secondary | ICD-10-CM

## 2020-01-24 DIAGNOSIS — Z79899 Other long term (current) drug therapy: Secondary | ICD-10-CM | POA: Diagnosis not present

## 2020-01-24 DIAGNOSIS — O039 Complete or unspecified spontaneous abortion without complication: Secondary | ICD-10-CM

## 2020-01-24 DIAGNOSIS — Z20822 Contact with and (suspected) exposure to covid-19: Secondary | ICD-10-CM | POA: Diagnosis not present

## 2020-01-24 DIAGNOSIS — Z01812 Encounter for preprocedural laboratory examination: Secondary | ICD-10-CM | POA: Insufficient documentation

## 2020-01-24 DIAGNOSIS — O034 Incomplete spontaneous abortion without complication: Secondary | ICD-10-CM | POA: Insufficient documentation

## 2020-01-24 DIAGNOSIS — Z3A16 16 weeks gestation of pregnancy: Secondary | ICD-10-CM | POA: Insufficient documentation

## 2020-01-24 LAB — CBC
HCT: 38 % (ref 36.0–46.0)
Hemoglobin: 12.5 g/dL (ref 12.0–15.0)
MCH: 29.8 pg (ref 26.0–34.0)
MCHC: 32.9 g/dL (ref 30.0–36.0)
MCV: 90.7 fL (ref 80.0–100.0)
Platelets: 292 10*3/uL (ref 150–400)
RBC: 4.19 MIL/uL (ref 3.87–5.11)
RDW: 13.3 % (ref 11.5–15.5)
WBC: 11.8 10*3/uL — ABNORMAL HIGH (ref 4.0–10.5)
nRBC: 0 % (ref 0.0–0.2)

## 2020-01-24 LAB — SARS CORONAVIRUS 2 (TAT 6-24 HRS): SARS Coronavirus 2: NEGATIVE

## 2020-01-24 MED ORDER — HYDROMORPHONE HCL 1 MG/ML IJ SOLN
0.5000 mg | Freq: Once | INTRAMUSCULAR | Status: AC
  Administered 2020-01-24: 0.5 mg via INTRAVENOUS
  Filled 2020-01-24: qty 1

## 2020-01-24 MED ORDER — LACTATED RINGERS IV BOLUS
1000.0000 mL | INTRAVENOUS | Status: AC
  Administered 2020-01-24: 1000 mL via INTRAVENOUS

## 2020-01-24 NOTE — Progress Notes (Signed)
Reminded pt to go for covid test today, pt verbalized understanding and states she will go today.

## 2020-01-24 NOTE — MAU Note (Addendum)
Pt complaining of vaginal cramping and bleeding at 2100. Scheduled for [facility] tomorrow for fetal demise. Wearing saturated pad, some blood seen but no clots of tissue.  IV placed and labs drawn. IV fluid started.

## 2020-01-24 NOTE — Progress Notes (Addendum)
Called provider to room for delivery of fetus. Many clots seen. Fetus sent for pathology.

## 2020-01-24 NOTE — MAU Provider Note (Signed)
Chief Complaint: Vaginal Bleeding and Abdominal Cramping   First Provider Initiated Contact with Patient 01/24/20 2144        SUBJECTIVE HPI: Jo Graham is a 34 y.o. [id number] at [redacted]w[redacted]d by LMP who presents to maternity admissions reporting cramping and bleeding since 2100.  Was diagnosed with an IUFD yesterday (16 wks by dates, 12 wk by [place]).  .Having severe pain with contractions. She denies vaginal bleeding, vaginal itching/burning, urinary symptoms, h/a, dizziness, n/v, or fever/chills.    Vaginal Bleeding The patient's primary symptoms include pelvic pain and vaginal bleeding. The patient's pertinent negatives include no genital itching, genital lesions or genital odor. This is a new problem. The current episode started yesterday. The problem occurs constantly. The problem has been gradually worsening. The pain is severe. She is pregnant. Associated symptoms include abdominal pain and back pain. Pertinent negatives include no chills, constipation, diarrhea, fever, frequency, nausea or vomiting. The vaginal discharge was bloody. The vaginal bleeding is heavier than menses. She has been passing clots. She has been passing tissue. Nothing aggravates the symptoms. She has tried nothing for the symptoms.  Abdominal Cramping This is a new problem. The current episode started today. The problem occurs intermittently. The problem has been unchanged. The pain is located in the LLQ, RLQ and suprapubic region. The quality of the pain is cramping. The abdominal pain does not radiate. Pertinent negatives include no constipation, diarrhea, fever, frequency, nausea or vomiting. Nothing aggravates the pain. The pain is relieved by nothing. She has tried nothing for the symptoms.   RN Note: Pt complaining of vaginal cramping and bleeding at 2100. Scheduled for [facility] tomorrow for fetal demise. Wearing saturated pad, some blood seen but no clots of tissue.  IV placed and labs drawn. IV fluid started.    Past  Medical History:  Diagnosis Date  . H/O candidiasis   . H/O cystitis    x1   Past Surgical History:  Procedure Laterality Date  . NO PAST SURGERIES     Social History   Socioeconomic History  . Marital status: Single    Spouse name: Not on file  . Number of children: Not on file  . Years of education: Not on file  . Highest education level: Not on file  Occupational History  . Not on file  Tobacco Use  . Smoking status: Never Smoker  . Smokeless tobacco: Never Used  Substance and Sexual Activity  . Alcohol use: No  . Drug use: No  . Sexual activity: Yes    Birth control/protection: None    Comment: currently pregnant  Other Topics Concern  . Not on file  Social History Narrative  . Not on file   Social Determinants of Health   Financial Resource Strain:   . Difficulty of Paying Living Expenses:   Food Insecurity:   . Worried About Programme researcher, broadcasting/film/video in the Last Year:   . Barista in the Last Year:   Transportation Needs:   . Freight forwarder (Medical):   Marland Kitchen Lack of Transportation (Non-Medical):   Physical Activity:   . Days of Exercise per Week:   . Minutes of Exercise per Session:   Stress:   . Feeling of Stress :   Social Connections:   . Frequency of Communication with Friends and Family:   . Frequency of Social Gatherings with Friends and Family:   . Attends Religious Services:   . Active Member of Clubs or Organizations:   .  Attends Banker Meetings:   Marland Kitchen Marital Status:   Intimate Partner Violence:   . Fear of Current or Ex-Partner:   . Emotionally Abused:   Marland Kitchen Physically Abused:   . Sexually Abused:    No current facility-administered medications on file prior to encounter.   Current Outpatient Medications on File Prior to Encounter  Medication Sig Dispense Refill  . acetaminophen (TYLENOL) 325 MG tablet Take 650 mg by mouth every 6 (six) hours as needed.    . Prenatal Vit-Fe Fumarate-FA (MULTIVITAMIN-PRENATAL) 27-0.8  MG TABS tablet Take 1 tablet by mouth daily at 12 noon.    . [DISCONTINUED] promethazine (PHENERGAN) 25 MG tablet Take 1 tablet (25 mg total) by mouth every 6 (six) hours as needed for nausea or vomiting. (Patient not taking: Reported on 12/23/2019) 30 tablet 0   No Known Allergies  I have reviewed patient's Past Medical Hx, Surgical Hx, Family Hx, Social Hx, medications and allergies.   ROS:  Review of Systems  Constitutional: Negative for chills and fever.  Gastrointestinal: Positive for abdominal pain. Negative for constipation, diarrhea, nausea and vomiting.  Genitourinary: Positive for pelvic pain and vaginal bleeding. Negative for frequency.  Musculoskeletal: Positive for back pain.   Review of Systems  Other systems negative   Physical Exam  Physical Exam Patient Vitals for the past 24 hrs:  BP Temp Temp src Pulse Resp SpO2  01/24/20 2121 (!) 123/48 98.2 F (36.8 C) Oral (!) 127 20 100 %   Constitutional: Well-developed, well-nourished female in no acute distressm but quite uncomfortable with c/o rectal pain with contractions.  .  Cardiovascular: normal rate Respiratory: normal effort GI: Abd soft, non-tender. Pos BS x 4 MS: Extremities nontender, no edema, normal ROM Neurologic: Alert and oriented x 4.  GU: Neg CVAT.  PELVIC EXAM: Fetus partially expelled from vagina, large amount of clots followed delivery of non viable 12 week sized fetus.  Pain reduced after delivery  LAB RESULTS Results for orders placed or performed during the hospital encounter of 01/24/20 (from the past 24 hour(s))  CBC     Status: Abnormal   Collection Time: 01/24/20  9:33 PM  Result Value Ref Range   WBC 11.8 (H) 4.0 - 10.5 K/uL   RBC 4.19 3.87 - 5.11 MIL/uL   Hemoglobin 12.5 12.0 - 15.0 g/dL   HCT 03.5 00.9 - 38.1 %   MCV 90.7 80.0 - 100.0 fL   MCH 29.8 26.0 - 34.0 pg   MCHC 32.9 30.0 - 36.0 g/dL   RDW 82.9 93.7 - 16.9 %   Platelets 292 150 - 400 K/uL   nRBC 0.0 0.0 - 0.2 %     --/--/O POS (05/07 1806)  IMAGING (first one is the last followup US post Cytotect and passage of clots) US PELVIS (TRANSABDOMINAL ONLY)  Addendum Date: 01/25/2020   ADDENDUM REPORT: 01/25/2020 06:37 ADDENDUM: Critical Value/emergent results were called by telephone at the time of interpretation on 01/25/2020 at 6:37 am to provider Wynelle Bourgeois , who verbally acknowledged these results. Electronically Signed   By: Kreg Shropshire M.D.   On: 01/25/2020 06:37   Result Date: 01/25/2020 CLINICAL DATA:  Spontaneous abortion 01/24/2020, received Cytotec, passage of multiple clots EXAM: TRANSABDOMINAL ULTRASOUND OF PELVIS TECHNIQUE: Transabdominal ultrasound examination of the pelvis was performed including evaluation of the uterus, ovaries, adnexal regions, and pelvic cul-de-sac. COMPARISON:  Ultrasound 01/24/2020 FINDINGS: Uterus Measurements: 10.7 x 6.7 x 7.2 cm = volume: 271 mL. No fibroids or other mass visualized.  Endometrium Thickness: 30 mm. Markedly thickened, heterogeneously echogenic and vascular appearance of the endometrium. No focal fluid collection or residual gestational sac is identified. No discrete endometrial mass. Right ovary Not visualized Left ovary Not visualized Other findings:  No abnormal free fluid. IMPRESSION: Persistently thickened, heterogeneous and vascular appearance of the endometrium. Sonographic findings are concerning for retained products of conception in the appropriate clinical setting. Currently attempting to contact the ordering provider with a critical value result. Addendum will be submitted upon case discussion. Electronically Signed: By: Lovena Le M.D. On: 01/25/2020 06:30   US PELVIS (TRANSABDOMINAL ONLY)  Result Date: 01/25/2020 CLINICAL DATA:  Passing blood clots.  Spontaneous abortion. EXAM: TRANSABDOMINAL ULTRASOUND OF PELVIS TECHNIQUE: Transabdominal ultrasound examination of the pelvis was performed including evaluation of the uterus, ovaries, adnexal  regions, and pelvic cul-de-sac. COMPARISON:  None. FINDINGS: Uterus Measurements: 13.5 x 6.8 x 6.7 cm = volume: 318 mL. No fibroids or other mass visualized. Endometrium Thickness: 31 mm. There is a heterogeneous area in the lower uterine segment without definite blood flow. This area measures 9.4 x 6.1 cm. Right ovary Measurements: 2.7 x 1.9 x 2.1 cm = volume: 5.7 mL. Normal appearance/no adnexal mass. Left ovary Not visualized Other findings:  No abnormal free fluid. IMPRESSION: Thickened endometrium without definite vascularity.Findings are nonspecific and could indicate endometrial blood products, although hypovascular retained products of conception cannot be excluded. Consider short term follow-up pelvic ultrasound or further evaluation with pelvic MRI with and without IV contrast, as clinically warranted. Electronically Signed   By: Ulyses Jarred M.D.   On: 01/25/2020 00:31    MAU Management/MDM: Fetus was delivering as I entered room Delivered intact nonviable fetus, c/w 12 wks Large amount of placenta and clot delivered, approx 662ml Dilaudid given for pain during delivery Sent for [place] to eval for retained products of conception, showed clot in lower uterus, likely not placental  Toradol given for pain after [place] Just at discharge, BP noted to be low, though patient asymptomatic Orthostatic VS ordered > somewhat orthostatic and dizzy Cytotec 828mcg given to help her expel remaining clot in uterus Several bags of IVF given Consulted Dr Nehemiah Settle who recommends repeating CBC and transfuse if under 8.  Hgb is stable from last check, still 9.6  (12.5 > 9.5 > 9.6) Measured blood loss has been almost 837ml 0600:  Got up to BR with no dizziness and passed urine and several large clots Will get F/U US (recomm by radiologist) to check progress on evacuation Radiologist called to say there are still retained POC in uterus Dr Ilda Basset updated that pt will keep appt for D&C this am  ASSESSMENT Single  IUP at [redacted]w[redacted]d Spontaneous abortion  Retained products of conception   PLAN Discharge to [place] as per Surgeon  Hansel Feinstein CNM, MSN Certified Nurse-Midwife 01/24/2020  9:44 PM

## 2020-01-25 ENCOUNTER — Ambulatory Visit (HOSPITAL_BASED_OUTPATIENT_CLINIC_OR_DEPARTMENT_OTHER): Payer: Managed Care, Other (non HMO) | Admitting: Anesthesiology

## 2020-01-25 ENCOUNTER — Encounter (HOSPITAL_COMMUNITY): Payer: Self-pay | Admitting: Family Medicine

## 2020-01-25 ENCOUNTER — Inpatient Hospital Stay (HOSPITAL_COMMUNITY): Payer: Managed Care, Other (non HMO)

## 2020-01-25 ENCOUNTER — Ambulatory Visit (HOSPITAL_BASED_OUTPATIENT_CLINIC_OR_DEPARTMENT_OTHER)
Admission: RE | Admit: 2020-01-25 | Discharge: 2020-01-25 | Disposition: A | Discharge status: Home / Self Care | Payer: Managed Care, Other (non HMO) | Attending: Obstetrics and Gynecology | Admitting: Obstetrics and Gynecology

## 2020-01-25 ENCOUNTER — Other Ambulatory Visit: Payer: Self-pay

## 2020-01-25 ENCOUNTER — Encounter (HOSPITAL_BASED_OUTPATIENT_CLINIC_OR_DEPARTMENT_OTHER)
Admission: RE | Disposition: A | Discharge status: Home / Self Care | Payer: Self-pay | Source: Home / Self Care | Attending: Obstetrics and Gynecology

## 2020-01-25 DIAGNOSIS — Z9889 Other specified postprocedural states: Secondary | ICD-10-CM

## 2020-01-25 DIAGNOSIS — Z79899 Other long term (current) drug therapy: Secondary | ICD-10-CM | POA: Insufficient documentation

## 2020-01-25 DIAGNOSIS — Z3A16 16 weeks gestation of pregnancy: Secondary | ICD-10-CM

## 2020-01-25 DIAGNOSIS — O034 Incomplete spontaneous abortion without complication: Secondary | ICD-10-CM | POA: Insufficient documentation

## 2020-01-25 DIAGNOSIS — O021 Missed abortion: Secondary | ICD-10-CM | POA: Diagnosis not present

## 2020-01-25 DIAGNOSIS — Z3A12 12 weeks gestation of pregnancy: Secondary | ICD-10-CM | POA: Diagnosis not present

## 2020-01-25 LAB — CBC
HCT: 29 % — ABNORMAL LOW (ref 36.0–46.0)
HCT: 29.8 % — ABNORMAL LOW (ref 36.0–46.0)
Hemoglobin: 9.5 g/dL — ABNORMAL LOW (ref 12.0–15.0)
Hemoglobin: 9.6 g/dL — ABNORMAL LOW (ref 12.0–15.0)
MCH: 30.3 pg (ref 26.0–34.0)
MCH: 29.5 pg (ref 26.0–34.0)
MCHC: 32.8 g/dL (ref 30.0–36.0)
MCHC: 32.2 g/dL (ref 30.0–36.0)
MCV: 92.4 fL (ref 80.0–100.0)
MCV: 91.7 fL (ref 80.0–100.0)
Platelets: 222 10*3/uL (ref 150–400)
Platelets: 273 10*3/uL (ref 150–400)
RBC: 3.14 MIL/uL — ABNORMAL LOW (ref 3.87–5.11)
RBC: 3.25 MIL/uL — ABNORMAL LOW (ref 3.87–5.11)
RDW: 13.3 % (ref 11.5–15.5)
RDW: 13.2 % (ref 11.5–15.5)
WBC: 10.4 10*3/uL (ref 4.0–10.5)
WBC: 12.9 10*3/uL — ABNORMAL HIGH (ref 4.0–10.5)
nRBC: 0 % (ref 0.0–0.2)
nRBC: 0 % (ref 0.0–0.2)

## 2020-01-25 LAB — POCT HEMOGLOBIN-HEMACUE: Hemoglobin: 6.4 g/dL — CL (ref 12.0–15.0)

## 2020-01-25 LAB — PREPARE RBC (CROSSMATCH)

## 2020-01-25 SURGERY — DILATION AND EVACUATION, UTERUS
Anesthesia: General | Site: Uterus

## 2020-01-25 MED ORDER — SODIUM CHLORIDE 0.9 % IV SOLN
INTRAVENOUS | Status: AC
  Filled 2020-01-25: qty 100

## 2020-01-25 MED ORDER — LIDOCAINE-EPINEPHRINE 1 %-1:100000 IJ SOLN
INTRAMUSCULAR | Status: AC
  Filled 2020-01-25: qty 1

## 2020-01-25 MED ORDER — LACTATED RINGERS IV SOLN: INTRAVENOUS | Status: DC

## 2020-01-25 MED ORDER — PROPOFOL 10 MG/ML IV BOLUS
INTRAVENOUS | Status: AC
  Filled 2020-01-25: qty 20

## 2020-01-25 MED ORDER — SUCCINYLCHOLINE CHLORIDE 200 MG/10ML IV SOSY
PREFILLED_SYRINGE | INTRAVENOUS | Status: AC
  Filled 2020-01-25: qty 10

## 2020-01-25 MED ORDER — MISOPROSTOL 200 MCG PO TABS
ORAL_TABLET | ORAL | Status: AC
  Filled 2020-01-25: qty 1

## 2020-01-25 MED ORDER — MISOPROSTOL 200 MCG PO TABS
400.0000 ug | ORAL_TABLET | Freq: Once | ORAL | Status: AC
  Administered 2020-01-25: 400 ug via BUCCAL
  Filled 2020-01-25: qty 2

## 2020-01-25 MED ORDER — OXYCODONE HCL 5 MG PO TABS: 5.0000 mg | ORAL_TABLET | ORAL | 0 refills | Status: DC | PRN

## 2020-01-25 MED ORDER — SODIUM CHLORIDE 0.9 % IV SOLN
100.0000 mg | Freq: Once | INTRAVENOUS | Status: AC
  Administered 2020-01-25: 100 mg via INTRAVENOUS

## 2020-01-25 MED ORDER — FENTANYL CITRATE (PF) 100 MCG/2ML IJ SOLN: 25.0000 ug | INTRAMUSCULAR | Status: DC | PRN

## 2020-01-25 MED ORDER — ONDANSETRON HCL 4 MG/2ML IJ SOLN: 4.0000 mg | Freq: Once | INTRAMUSCULAR | Status: DC | PRN

## 2020-01-25 MED ORDER — LIDOCAINE 2% (20 MG/ML) 5 ML SYRINGE
INTRAMUSCULAR | Status: AC
  Filled 2020-01-25: qty 5

## 2020-01-25 MED ORDER — SODIUM CHLORIDE 0.9% IV SOLUTION: Freq: Once | INTRAVENOUS | Status: DC

## 2020-01-25 MED ORDER — OXYCODONE-ACETAMINOPHEN 5-325 MG PO TABS: 1.0000 | ORAL_TABLET | Freq: Four times a day (QID) | ORAL | 0 refills | Status: DC | PRN

## 2020-01-25 MED ORDER — SUCCINYLCHOLINE CHLORIDE 20 MG/ML IJ SOLN
INTRAMUSCULAR | Status: DC | PRN
  Administered 2020-01-25: 120 mg via INTRAVENOUS

## 2020-01-25 MED ORDER — METHYLERGONOVINE MALEATE 0.2 MG/ML IJ SOLN
INTRAMUSCULAR | Status: AC
  Filled 2020-01-25: qty 1

## 2020-01-25 MED ORDER — MISOPROSTOL 200 MCG PO TABS: 400.0000 ug | ORAL_TABLET | Freq: Once | ORAL | Status: DC

## 2020-01-25 MED ORDER — LACTATED RINGERS IV BOLUS
1000.0000 mL | INTRAVENOUS | Status: AC
  Administered 2020-01-25: 1000 mL via INTRAVENOUS

## 2020-01-25 MED ORDER — FENTANYL CITRATE (PF) 100 MCG/2ML IJ SOLN
INTRAMUSCULAR | Status: DC | PRN
  Administered 2020-01-25 (×2): 50 ug via INTRAVENOUS

## 2020-01-25 MED ORDER — MIDAZOLAM HCL 5 MG/5ML IJ SOLN
INTRAMUSCULAR | Status: DC | PRN
  Administered 2020-01-25: 2 mg via INTRAVENOUS

## 2020-01-25 MED ORDER — GLYCOPYRROLATE PF 0.2 MG/ML IJ SOSY
PREFILLED_SYRINGE | INTRAMUSCULAR | Status: AC
  Filled 2020-01-25: qty 1

## 2020-01-25 MED ORDER — MIDAZOLAM HCL 2 MG/2ML IJ SOLN
INTRAMUSCULAR | Status: AC
  Filled 2020-01-25: qty 2

## 2020-01-25 MED ORDER — MISOPROSTOL 200 MCG PO TABS: 200.0000 ug | ORAL_TABLET | Freq: Once | ORAL | Status: DC

## 2020-01-25 MED ORDER — FENTANYL CITRATE (PF) 100 MCG/2ML IJ SOLN
INTRAMUSCULAR | Status: AC
  Filled 2020-01-25: qty 2

## 2020-01-25 MED ORDER — METHYLERGONOVINE MALEATE 0.2 MG PO TABS
ORAL_TABLET | ORAL | Status: AC
  Filled 2020-01-25: qty 1

## 2020-01-25 MED ORDER — IRON POLYSACCH CMPLX-B12-FA 150-0.025-1 MG PO CAPS: 1.0000 | ORAL_CAPSULE | Freq: Every day | ORAL | 0 refills | Status: AC

## 2020-01-25 MED ORDER — SILVER NITRATE-POT NITRATE 75-25 % EX MISC
CUTANEOUS | Status: DC | PRN
  Administered 2020-01-25: 3

## 2020-01-25 MED ORDER — OXYTOCIN 10 UNIT/ML IJ SOLN
INTRAMUSCULAR | Status: AC
  Filled 2020-01-25: qty 1

## 2020-01-25 MED ORDER — GLYCOPYRROLATE 0.2 MG/ML IJ SOLN
INTRAMUSCULAR | Status: DC | PRN
  Administered 2020-01-25: .1 mg via INTRAVENOUS

## 2020-01-25 MED ORDER — LACTATED RINGERS IV BOLUS
1000.0000 mL | Freq: Once | INTRAVENOUS | Status: AC
  Administered 2020-01-25: 1000 mL via INTRAVENOUS

## 2020-01-25 MED ORDER — OXYCODONE HCL 5 MG/5ML PO SOLN: 5.0000 mg | Freq: Once | ORAL | Status: DC | PRN

## 2020-01-25 MED ORDER — PROPOFOL 10 MG/ML IV BOLUS
INTRAVENOUS | Status: DC | PRN
  Administered 2020-01-25: 200 mg via INTRAVENOUS

## 2020-01-25 MED ORDER — ONDANSETRON HCL 4 MG/2ML IJ SOLN
INTRAMUSCULAR | Status: AC
  Filled 2020-01-25: qty 2

## 2020-01-25 MED ORDER — SILVER NITRATE-POT NITRATE 75-25 % EX MISC
CUTANEOUS | Status: AC
  Filled 2020-01-25: qty 10

## 2020-01-25 MED ORDER — POLYETHYLENE GLYCOL 3350 17 G PO PACK: 17.0000 g | PACK | Freq: Every day | ORAL | 0 refills | Status: AC

## 2020-01-25 MED ORDER — DEXAMETHASONE SODIUM PHOSPHATE 10 MG/ML IJ SOLN
INTRAMUSCULAR | Status: AC
  Filled 2020-01-25: qty 1

## 2020-01-25 MED ORDER — KETOROLAC TROMETHAMINE 30 MG/ML IJ SOLN
30.0000 mg | Freq: Once | INTRAMUSCULAR | Status: AC
  Administered 2020-01-25: 30 mg via INTRAVENOUS
  Filled 2020-01-25: qty 1

## 2020-01-25 MED ORDER — KETOROLAC TROMETHAMINE 30 MG/ML IJ SOLN
INTRAMUSCULAR | Status: AC
  Filled 2020-01-25: qty 1

## 2020-01-25 MED ORDER — LIDOCAINE HCL (CARDIAC) PF 100 MG/5ML IV SOSY
PREFILLED_SYRINGE | INTRAVENOUS | Status: DC | PRN
  Administered 2020-01-25: 80 mg via INTRAVENOUS

## 2020-01-25 MED ORDER — ONDANSETRON HCL 4 MG/2ML IJ SOLN
INTRAMUSCULAR | Status: DC | PRN
  Administered 2020-01-25: 4 mg via INTRAVENOUS

## 2020-01-25 MED ORDER — METHYLERGONOVINE MALEATE 0.2 MG/ML IJ SOLN
INTRAMUSCULAR | Status: DC | PRN
Start: 2020-01-25 — End: 2020-01-25
  Administered 2020-01-25: .2 mg via INTRAMUSCULAR

## 2020-01-25 MED ORDER — IBUPROFEN 600 MG PO TABS
600.0000 mg | ORAL_TABLET | Freq: Four times a day (QID) | ORAL | 1 refills | Status: DC | PRN
Start: 2020-01-25 — End: 2020-05-29

## 2020-01-25 MED ORDER — LIDOCAINE-EPINEPHRINE 1 %-1:100000 IJ SOLN
INTRAMUSCULAR | Status: DC | PRN
  Administered 2020-01-25: 20 mL

## 2020-01-25 MED ORDER — OXYCODONE HCL 5 MG PO TABS: 5.0000 mg | ORAL_TABLET | Freq: Once | ORAL | Status: DC | PRN

## 2020-01-25 MED ORDER — DEXAMETHASONE SODIUM PHOSPHATE 4 MG/ML IJ SOLN
INTRAMUSCULAR | Status: DC | PRN
  Administered 2020-01-25: 10 mg via INTRAVENOUS

## 2020-01-25 SURGICAL SUPPLY — 27 items
CATH ROBINSON RED A/P 14FR (CATHETERS) ×3 IMPLANT
COVER PROBE 5X48 (MISCELLANEOUS)
GAUZE 4X4 16PLY RFD (DISPOSABLE) ×3 IMPLANT
GLOVE BIOGEL PI IND STRL 7.0 (GLOVE) ×1 IMPLANT
GLOVE BIOGEL PI IND STRL 7.5 (GLOVE) ×1 IMPLANT
GLOVE BIOGEL PI INDICATOR 7.0 (GLOVE) ×2
GLOVE BIOGEL PI INDICATOR 7.5 (GLOVE) ×2
GLOVE SURG SS PI 7.0 STRL IVOR (GLOVE) ×3 IMPLANT
GOWN STRL REUS W/ TWL LRG LVL3 (GOWN DISPOSABLE) ×1 IMPLANT
GOWN STRL REUS W/TWL LRG LVL3 (GOWN DISPOSABLE) ×3
GOWN STRL REUS W/TWL XL LVL3 (GOWN DISPOSABLE) ×3 IMPLANT
HOSE CONNECTING 18IN BERKELEY (TUBING) IMPLANT
KIT BERKELEY 1ST TRI 3/8 NO TR (MISCELLANEOUS) ×3 IMPLANT
KIT BERKELEY 1ST TRIMESTER 3/8 (MISCELLANEOUS) ×3 IMPLANT
KIT CVR 48X5XPRB PLUP LF (MISCELLANEOUS) IMPLANT
NS IRRIG 1000ML POUR BTL (IV SOLUTION) ×3 IMPLANT
PACK VAGINAL MINOR WOMEN LF (CUSTOM PROCEDURE TRAY) ×3 IMPLANT
PAD OB MATERNITY 4.3X12.25 (PERSONAL CARE ITEMS) ×3 IMPLANT
PAD PREP 24X48 CUFFED NSTRL (MISCELLANEOUS) ×3 IMPLANT
SET BERKELEY SUCTION TUBING (SUCTIONS) ×3 IMPLANT
SLEEVE SCD COMPRESS KNEE MED (MISCELLANEOUS) ×3 IMPLANT
TOWEL GREEN STERILE FF (TOWEL DISPOSABLE) ×3 IMPLANT
VACURETTE 10 RIGID CVD (CANNULA) ×3 IMPLANT
VACURETTE 12 RIGID CVD (CANNULA) ×3 IMPLANT
VACURETTE 7MM CVD STRL WRAP (CANNULA) IMPLANT
VACURETTE 8 RIGID CVD (CANNULA) IMPLANT
VACURETTE 9 RIGID CVD (CANNULA) IMPLANT

## 2020-01-25 NOTE — Discharge Instructions (Signed)

## 2020-01-25 NOTE — Anesthesia Preprocedure Evaluation (Signed)
Anesthesia Evaluation  Patient identified by MRN, date of birth, ID band Patient awake    Reviewed: Allergy & Precautions, NPO status , Patient's Chart, lab work & pertinent test results  Airway Mallampati: II  TM Distance: >3 FB Neck ROM: Full    Dental no notable dental hx. (+) Teeth Intact   Pulmonary neg pulmonary ROS,    Pulmonary exam normal breath sounds clear to auscultation       Cardiovascular negative cardio ROS Normal cardiovascular exam Rhythm:Regular Rate:Normal     Neuro/Psych negative neurological ROS  negative psych ROS   GI/Hepatic negative GI ROS, Neg liver ROS,   Endo/Other  Morbid obesity  Renal/GU negative Renal ROS  negative genitourinary   Musculoskeletal negative musculoskeletal ROS (+)   Abdominal (+) + obese,   Peds  Hematology  (+) anemia ,   Anesthesia Other Findings   Reproductive/Obstetrics (+) Pregnancy 16 weeks by LMP, 12 weeks by [place] Missed Ab                             Anesthesia Physical Anesthesia Plan  ASA: III  Anesthesia Plan: General   Post-op Pain Management:    Induction: Intravenous, Rapid sequence and Cricoid pressure planned  PONV Risk Score and Plan: 4 or greater and Ondansetron, Dexamethasone, Treatment may vary due to age or medical condition and Midazolam  Airway Management Planned: Oral ETT  Additional Equipment:   Intra-op Plan:   Post-operative Plan: Extubation in OR  Informed Consent: I have reviewed the patients History and Physical, chart, labs and discussed the procedure including the risks, benefits and alternatives for the proposed anesthesia with the patient or authorized representative who has indicated his/her understanding and acceptance.     Dental advisory given  Plan Discussed with: CRNA and Surgeon  Anesthesia Plan Comments:         Anesthesia Quick Evaluation

## 2020-01-25 NOTE — Op Note (Signed)
Operative Note   01/25/2020  PRE-OP DIAGNOSIS: Incomplete abortion at 12 weeks   POST-OP DIAGNOSIS: Same.  SURGEON: Surgeon(s) and Role:    * Verner Kopischke, Billey Gosling, MD - Primary  ASSISTANT: None  PROCEDURE:  Suction dilation and curettage  ANESTHESIA: General and paracervical block  ESTIMATED BLOOD LOSS:  DRAINS: I/O cath done pre op  TOTAL IV FLUIDS: per anesthesia note  SPECIMENS: products of conception to pathology  VTE PROPHYLAXIS: SCDs to the bilateral lower extremities  ANTIBIOTICS: Doxycycline 100mg  IV x 1 pre op  COMPLICATIONS: none  DISPOSITION: PACU - hemodynamically stable.  CONDITION: stable  BLOOD TYPE: O POS. Rhogam given:not applicable  FINDINGS: Exam under anesthesia revealed 10 week sized uterus with no masses and bilateral adnexa without masses or fullness. Copious amounts of necrotic appearing products of conception were seen, with gritty texture in all four quadrants at the conclusion of the case   PROCEDURE IN DETAIL:  After informed consent was obtained, the patient was taken to the operating room where anesthesia was obtained without difficulty. The patient was positioned in the dorsal lithotomy position in [place] stirrups. The patient was examined under anesthesia, with the above noted findings.  Large amount of blood and clot, approximately [place], was noted at this time. The bi-valved speculum was placed inside the patient's vagina, and the vault was full of clot, old blood and POCs. This was removed with ringed forceps and suction. The cervix was then visualized and was visually dilated to 2-3cm due to clot and POCs in the canal. The tenaculum was placed on the anterior lip and clot and POCs removed with the ringed forceps.  A paracervical block was achieved with 66mL of 1% lidocaine and then the suction was then calibrated to 30m and connected to the number 12 cannula, which was then introduced with the above noted findings. Multiple passes were done  with still a large amount of POCs removed. A gentle curettage was done at the end and yield no products of conception and a gritty texture noted in all four quadrants.   The suction was then done one more time to remove any remaining curettage material.   Right tenaculum site bleeding so figure of eight stitch of #1 chromic placed. Excellent hemostasis was noted, and all instruments were removed, with excellent hemostasis noted throughout.  She was then taken out of dorsal lithotomy. The patient tolerated the procedure well.  Sponge, lap and instrument counts were correct x2.  The patient was taken to recovery room in excellent condition.  Methergine 0.2mg  IM x 1 was given for bleeding prophylaxis.   istat Hgb is 6, which I believe is accurate given what she was telling me in the pre op area and what I saw intra operatively. Will transfuse 1U PRBC and then patient okay for discharge to home.   MD [phone] Attending Center for [facility])

## 2020-01-25 NOTE — H&P (Deleted)
  The note originally documented on this encounter has been moved the the encounter in which it belongs.  

## 2020-01-25 NOTE — Transfer of Care (Signed)
Immediate Anesthesia Transfer of Care Note  Patient: Jo Graham  Procedure(s) Performed: DILATATION AND EVACUATION (N/A Uterus)  Patient Location: PACU  Anesthesia Type:General  Level of Consciousness: awake, alert  and oriented  Airway & Oxygen Therapy: Patient Spontanous Breathing and Patient connected to nasal cannula oxygen  Post-op Assessment: Report given to RN and Post -op Vital signs reviewed and stable  Post vital signs: Reviewed and stable  Last Vitals:  Vitals Value Taken Time  BP 123/71 01/25/20 1200  Temp 37.2 C 01/25/20 1117  Pulse 108 01/25/20 1207  Resp 0 01/25/20 1207  SpO2 98 % 01/25/20 1207  Vitals shown include unvalidated device data.  Last Pain:  Vitals:   01/25/20 1200  PainSc: Asleep         Complications: No apparent anesthesia complications

## 2020-01-25 NOTE — Anesthesia Procedure Notes (Signed)
Procedure Name: Intubation Date/Time: 01/25/2020 10:31 AM Performed by: Cleda Clarks, CRNA Pre-anesthesia Checklist: Patient identified, Emergency Drugs available, Suction available and Patient being monitored Patient Re-evaluated:Patient Re-evaluated prior to induction Oxygen Delivery Method: Circle system utilized Preoxygenation: Pre-oxygenation with 100% oxygen Induction Type: IV induction, Cricoid Pressure applied and Rapid sequence Ventilation: Mask ventilation without difficulty Laryngoscope Size: Miller and 2 Grade View: Grade II Tube type: Oral Tube size: 7.0 mm Number of attempts: 1 Airway Equipment and Method: Stylet and Oral airway Placement Confirmation: ETT inserted through vocal cords under direct vision,  positive ETCO2 and breath sounds checked- equal and bilateral Secured at: 21 cm Tube secured with: Tape Dental Injury: Teeth and Oropharynx as per pre-operative assessment

## 2020-01-25 NOTE — Anesthesia Postprocedure Evaluation (Signed)
Anesthesia Post Note  Patient: Jo Graham  Procedure(s) Performed: DILATATION AND EVACUATION (N/A Uterus)     Patient location during evaluation: PACU Anesthesia Type: General Level of consciousness: awake and alert and oriented Pain management: pain level controlled Vital Signs Assessment: post-procedure vital signs reviewed and stable Respiratory status: spontaneous breathing, nonlabored ventilation and respiratory function stable Cardiovascular status: blood pressure returned to baseline and stable Postop Assessment: no apparent nausea or vomiting Anesthetic complications: no    Last Vitals:  Vitals:   01/25/20 1345 01/25/20 1400  BP: 118/69 125/70  Pulse: 98 (!) 108  Resp: (!) 24 20  Temp:  37.4 C  SpO2: 99% 98%    Last Pain:  Vitals:   01/25/20 1400  TempSrc:   PainSc: 0-No pain                 Aristea Posada A.

## 2020-01-25 NOTE — MAU Note (Signed)
Confirmed with surgery center that pt could be discharged from MAU with IV intact. Provider Hilda Lias, CNM) made aware.

## 2020-01-25 NOTE — H&P (Signed)
Obstetrics & Gynecology Surgical H&P   Date of Surgery: 01/25/2020    Primary OBGYN: [facility]  Reason for Admission: incomplete AB  History of Present Illness: Ms. Cozine is a 34 y.o. [id number] (Patient's last menstrual period was 10/03/2019.), with the above CC.  Patient went to MAU on 6/17 for vaginal spotting and dx with a miscarriage. Pt was 16wks by LMP but only 12 by CRL and no cardiac motion seen.   She was posted for today for suction d&c but came in overnight to the MAU for heavy VB. She passed the baby but had retained POCs that was managed with cytotec and IVF boluses. They felt her stable enough to discharge for her scheduled d&c today.   Pt still having some bleeding but denies any s/s of anemia.   Pt states she was to have her first visit with [facility] today.   ROS: A 12-point review of systems was performed and negative, except as stated in the above HPI.  OBGYN History: As per HPI. OB History  Gravida Para Term Preterm AB Living  4 2 1 1 1 2   SAB TAB Ectopic Multiple Live Births      1   2    # Outcome Date GA Lbr Len/2nd Weight Sex Delivery Anes PTL Lv  4 Current           3 Ectopic 2019          2 Term 02/15/12 [redacted]w[redacted]d 13:33 / 01:12 3345 g F Vag-Spont EPI  LIV     Birth Comments: Birthmark onright upper buttocks  1 Preterm 08/2007 [redacted]w[redacted]d 08:00 2268 g F Vag-Spont EPI Y LIV      Past Medical History: Past Medical History:  Diagnosis Date  . H/O candidiasis   . H/O cystitis    x1    Past Surgical History: Past Surgical History:  Procedure Laterality Date  . NO PAST SURGERIES      Family History:  Family History  Problem Relation Age of Onset  . Asthma Maternal Aunt   . Cancer Maternal Aunt     Social History:  Social History   Socioeconomic History  . Marital status: Single    Spouse name: Not on file  . Number of children: Not on file  . Years of education: Not on file  . Highest education level: Not on file  Occupational History   . Not on file  Tobacco Use  . Smoking status: Never Smoker  . Smokeless tobacco: Never Used  Substance and Sexual Activity  . Alcohol use: No  . Drug use: No  . Sexual activity: Yes    Birth control/protection: None    Comment: currently pregnant  Other Topics Concern  . Not on file  Social History Narrative  . Not on file   Social Determinants of Health   Financial Resource Strain:   . Difficulty of Paying Living Expenses:   Food Insecurity:   . Worried About 2269 in the Last Year:   . Programme researcher, broadcasting/film/video in the Last Year:   Transportation Needs:   . Barista (Medical):   Freight forwarder Lack of Transportation (Non-Medical):   Physical Activity:   . Days of Exercise per Week:   . Minutes of Exercise per Session:   Stress:   . Feeling of Stress :   Social Connections:   . Frequency of Communication with Friends and Family:   . Frequency of Social Gatherings with Friends  and Family:   . Attends Religious Services:   . Active Member of Clubs or Organizations:   . Attends Archivist Meetings:   Marland Kitchen Marital Status:   Intimate Partner Violence:   . Fear of Current or Ex-Partner:   . Emotionally Abused:   Marland Kitchen Physically Abused:   . Sexually Abused:     Allergy: No Known Allergies  Current Outpatient Medications: Medications Prior to Admission  Medication Sig Dispense Refill Last Dose  . acetaminophen (TYLENOL) 325 MG tablet Take 650 mg by mouth every 6 (six) hours as needed.   01/24/2020 at Unknown time  . ibuprofen (ADVIL) 600 MG tablet Take 1 tablet (600 mg total) by mouth every 6 (six) hours as needed. 30 tablet 1   . oxyCODONE (OXY IR/ROXICODONE) 5 MG immediate release tablet Take 1 tablet (5 mg total) by mouth every 4 (four) hours as needed for moderate pain or severe pain. 10 tablet 0   . Prenatal Vit-Fe Fumarate-FA (MULTIVITAMIN-PRENATAL) 27-0.8 MG TABS tablet Take 1 tablet by mouth daily at 12 noon.   Past Week at Unknown time     Hospital  Medications: Current Facility-Administered Medications  Medication Dose Route Frequency Provider Last Rate Last Admin  . doxycycline (VIBRAMYCIN) 100 mg in sodium chloride 0.9 % 250 mL IVPB  100 mg Intravenous Once Aletha Halim, MD      . lactated ringers infusion   Intravenous Continuous Lynda Rainwater, MD 10 mL/hr at 01/25/20 0910 New Bag at 01/25/20 0910  . lactated ringers infusion   Intravenous Continuous Aletha Halim, MD       No current outpatient medications on file.     Physical Exam:  Current Vital Signs 24h Vital Sign Ranges  T 99.7 F (37.6 C) Temp  Avg: 98.8 F (37.1 C)  Min: 98.2 F (36.8 C)  Max: 99.7 F (37.6 C)  BP 117/69 BP  Min: 76/52  Max: 126/59  HR 100 Pulse  Avg: 101.9  Min: 82  Max: 127  RR 16 Resp  Avg: 18  Min: 16  Max: 20  SaO2 100 % Room Air SpO2  Avg: 99.6 %  Min: 98 %  Max: 100 %       24 Hour I/O Current Shift I/O  Time Ins Outs 06/08 0701 - 06/09 0700 In: -  Out: 604  No intake/output data recorded.    Body mass index is 41.61 kg/m. General appearance: Well nourished, well developed female in no acute distress.  HEENT: sclera normal Cardiovascular: S1, S2 normal, no murmur, rub or gallop, regular rate and rhythm Respiratory:  Clear to auscultation bilateral. Normal respiratory effort Abdomen: positive bowel sounds and no masses, hernias; diffusely non tender to palpation, non distended Neuro/Psych:  Normal mood and affect.  Skin:  Warm and dry.  Extremities: no clubbing, cyanosis, or edema.  Laboratory: COVID: negative Recent Labs  Lab 01/24/20 2133 01/25/20 0212 01/25/20 0456  WBC 11.8* 10.4 12.9*  HGB 12.5 9.5* 9.6*  HCT 38.0 29.0* 29.8*  PLT 292 222 273   O POS   Imaging:  CLINICAL DATA:  Spontaneous abortion 01/24/2020, received Cytotec, passage of multiple clots  EXAM: TRANSABDOMINAL ULTRASOUND OF PELVIS  TECHNIQUE: Transabdominal ultrasound examination of the pelvis was performed including evaluation  of the uterus, ovaries, adnexal regions, and pelvic cul-de-sac.  COMPARISON:  Ultrasound 01/24/2020  FINDINGS: Uterus  Measurements: 10.7 x 6.7 x 7.2 cm = volume: 271 mL. No fibroids or other mass visualized.  Endometrium  Thickness:  30 mm. Markedly thickened, heterogeneously echogenic and vascular appearance of the endometrium. No focal fluid collection or residual gestational sac is identified. No discrete endometrial mass.  Right ovary  Not visualized  Left ovary  Not visualized  Other findings:  No abnormal free fluid.  IMPRESSION: Persistently thickened, heterogeneous and vascular appearance of the endometrium. Sonographic findings are concerning for retained products of conception in the appropriate clinical setting.  Currently attempting to contact the ordering provider with a critical value result. Addendum will be submitted upon case discussion.  Electronically Signed: By: Kreg Shropshire M.D. On: 01/25/2020 06:30  Assessment: Ms. Human is a 34 y.o. [id number] (Patient's last menstrual period was 10/03/2019.) here for scheduled d&c. Pt stable  Plan: Pt consented for suction d&c for rPOCs. Can proceed when OR is ready   Cornelia Copa MD Attending Center for [facility] (Faculty [facility]) [phone]

## 2020-01-25 NOTE — Discharge Instructions (Addendum)
We will discuss your surgery once again in detail at your post-op visit in two to four weeks. If you havent already done so, please call to make your appointment as soon as possible.  Dilation and Curettage or Vacuum Curettage, Care After These instructions give you information on caring for yourself after your procedure. Your doctor may also give you more specific instructions. Call your doctor if you have any problems or questions after your procedure. HOME CARE  Do not drive for 24 hours.  Wait 1 week before doing any activities that wear you out.  Do not stand for a long time.  Limit stair climbing to once or twice a day.  Rest often.  Continue with your usual diet.  Drink enough fluids to keep your pee (urine) clear or pale yellow.  If you have a hard time pooping (constipation), you may:  Take a medicine to help you go poop (laxative) as told by your doctor.  Eat more fruit and bran.  Drink more fluids.  Take showers, not baths, for as long as told by your doctor.  Do not swim or use a hot tub until your doctor says it is okay.  Have someone with you for 1day after the procedure.  Do not douche, use tampons, or have sex (intercourse) until seen by your doctor  Only take medicines as told by your doctor. Do not take aspirin. It can cause bleeding.  Keep all doctor visits. GET HELP IF:  You have cramps or pain not helped by medicine.  You have new pain in the belly (abdomen).  You have a bad smelling fluid coming from your vagina.  You have a rash.  You have problems with any medicine. GET HELP RIGHT AWAY IF:   You start to bleed more than a regular period.  You have a fever.  You have chest pain.  You have trouble breathing.  You feel dizzy or feel like passing out (fainting).  You pass out.  You have pain in the tops of your shoulders.  You have vaginal bleeding with or without clumps of blood (blood clots). MAKE SURE YOU:  Understand  these instructions.  Will watch your condition.  Will get help right away if you are not doing well or get worse. Document Released: 05/13/2008 Document Revised: 08/09/2013 Document Reviewed: 03/03/2013 [facility] Patient Information 2015 [place], [place]. This information is not intended to replace advice given to you by your health care provider. Make sure you discuss any questions you have with your health care provider.    Iron Deficiency Anemia, Adult Iron-deficiency anemia is when you have a low amount of red blood cells or hemoglobin. This happens because you have too little iron in your body. Hemoglobin carries oxygen to parts of the body. Anemia can cause your body to not get enough oxygen. It may or may not cause symptoms. Follow these instructions at home: Medicines  Take over-the-counter and prescription medicines only as told by your doctor. This includes iron pills (supplements) and vitamins.  If you cannot handle taking iron pills by mouth, ask your doctor about getting iron through: ? A vein (intravenously). ? A shot (injection) into a muscle.  Take iron pills when your stomach is empty. If you cannot handle this, take them with food.  Do not drink milk or take antacids at the same time as your iron pills.  To prevent trouble pooping (constipation), eat fiber or take medicine (stool softener) as told by your doctor. Eating  and drinking   Talk with your doctor before changing the foods you eat. He or she may tell you to eat foods that have a lot of iron, such as: ? Liver. ? Lowfat (lean) beef. ? Breads and cereals that have iron added to them (fortified breads and cereals). ? Eggs. ? Dried fruit. ? Dark green, leafy vegetables.  Drink enough fluid to keep your pee (urine) clear or pale yellow.  Eat fresh fruits and vegetables that are high in vitamin C. They help your body to use iron. Foods with a lot of vitamin C  include: ? Oranges. ? Peppers. ? Tomatoes. ? Mangoes. General instructions  Return to your normal activities as told by your doctor. Ask your doctor what activities are safe for you.  Keep yourself clean, and keep things clean around you (your surroundings). Anemia can make you get sick more easily.  Keep all follow-up visits as told by your doctor. This is important. Contact a doctor if:  You feel sick to your stomach (nauseous).  You throw up (vomit).  You feel weak.  You are sweating for no clear reason.  You have trouble pooping, such as: ? Pooping (having a bowel movement) less than 3 times a week. ? Straining to poop. ? Having poop that is hard, dry, or larger than normal. ? Feeling full or bloated. ? Pain in the lower belly. ? Not feeling better after pooping. Get help right away if:  You pass out (faint). If this happens, do not drive yourself to the hospital. Call your local emergency services (911 in the [place]  You have chest pain.  You have shortness of breath that: ? Is very bad. ? Gets worse with physical activity.  You have a fast heartbeat.  You get light-headed when getting up from sitting or lying down. This information is not intended to replace advice given to you by your health care provider. Make sure you discuss any questions you have with your health care provider. Document Revised: 07/17/2017 Document Reviewed: 04/23/2016 Elsevier Patient Education  [place] Instructions  Activity: Get plenty of rest for the remainder of the day. A responsible individual must stay with you for 24 hours following the procedure.  For the next 24 hours, DO NOT: -Drive a car -Paediatric nurse -Drink alcoholic beverages -Take any medication unless instructed by your physician -Make any legal decisions or sign important papers.  Meals: Start with liquid foods such as gelatin or soup. Progress to regular foods as  tolerated. Avoid greasy, spicy, heavy foods. If nausea and/or vomiting occur, drink only clear liquids until the nausea and/or vomiting subsides. Call your physician if vomiting continues.  Special Instructions/Symptoms: Your throat may feel dry or sore from the anesthesia or the breathing tube placed in your throat during surgery. If this causes discomfort, gargle with warm salt water. The discomfort should disappear within 24 hours.

## 2020-01-26 ENCOUNTER — Encounter: Payer: Self-pay | Admitting: Advanced Practice Midwife

## 2020-01-26 ENCOUNTER — Encounter (HOSPITAL_BASED_OUTPATIENT_CLINIC_OR_DEPARTMENT_OTHER): Payer: Self-pay | Admitting: Obstetrics and Gynecology

## 2020-01-26 DIAGNOSIS — O039 Complete or unspecified spontaneous abortion without complication: Secondary | ICD-10-CM | POA: Insufficient documentation

## 2020-01-26 LAB — BPAM RBC
Blood Product Expiration Date: 202107102359
ISSUE DATE / TIME: 202106091217
Unit Type and Rh: 5100

## 2020-01-26 LAB — TYPE AND SCREEN
ABO/RH(D): O POS
Antibody Screen: NEGATIVE
Unit division: 0

## 2020-01-26 LAB — SURGICAL PATHOLOGY

## 2020-03-01 ENCOUNTER — Ambulatory Visit (INDEPENDENT_AMBULATORY_CARE_PROVIDER_SITE_OTHER): Payer: Managed Care, Other (non HMO) | Admitting: Obstetrics and Gynecology

## 2020-03-01 ENCOUNTER — Encounter: Payer: Self-pay | Admitting: Obstetrics and Gynecology

## 2020-03-01 ENCOUNTER — Other Ambulatory Visit: Payer: Self-pay

## 2020-03-01 VITALS — BP 122/80 | HR 90 | Ht 61.0 in | Wt 217.9 lb

## 2020-03-01 DIAGNOSIS — Z09 Encounter for follow-up examination after completed treatment for conditions other than malignant neoplasm: Secondary | ICD-10-CM

## 2020-03-01 DIAGNOSIS — Z9889 Other specified postprocedural states: Secondary | ICD-10-CM

## 2020-03-01 DIAGNOSIS — Z8759 Personal history of other complications of pregnancy, childbirth and the puerperium: Secondary | ICD-10-CM

## 2020-03-01 NOTE — Progress Notes (Signed)
Obstetrics and Gynecology Visit Return Patient Evaluation  Appointment Date: 03/01/2020  OBGYN Clinic: [facility]  Chief Complaint: follow up D&C  History of Present Illness:  Jo Graham is a 34 y.o. [id number] s/p 6/9 d&c for 12wk IUFD. She was discharged from the pacu. Pathology benign   Interval History: Since that time, she states that she is doing well and is just finishing a period.   Review of Systems: as noted in the History of Present Illness.  Medications:  PN vitamin  Allergies: has No Known Allergies.  Physical Exam:  BP 122/80   Pulse 90   Ht 5\' 1"  (1.549 m)   Wt 217 lb 14.4 oz (98.8 kg)   LMP 10/01/2019 (Exact Date)   Breastfeeding Unknown   BMI 41.17 kg/m  Body mass index is 41.17 kg/m. General appearance: Well nourished, well developed female in no acute distress.  Neuro/Psych:  Normal mood and affect.    Assessment: pt doing well  Plan:  1. Postop check Doing well. May want to try again. D/w her that if becomes pregnant to call 10/03/2019 as soon as she knows so we can follow her closely given history of ectopic   RTC: PRN  [place] MD Attending Center for [facility])

## 2020-05-29 ENCOUNTER — Ambulatory Visit (INDEPENDENT_AMBULATORY_CARE_PROVIDER_SITE_OTHER): Payer: Managed Care, Other (non HMO)

## 2020-05-29 ENCOUNTER — Other Ambulatory Visit: Payer: Self-pay

## 2020-05-29 VITALS — BP 112/66 | HR 89 | Wt 219.8 lb

## 2020-05-29 DIAGNOSIS — Z3201 Encounter for pregnancy test, result positive: Secondary | ICD-10-CM

## 2020-05-29 LAB — POCT PREGNANCY, URINE: Preg Test, Ur: POSITIVE — AB

## 2020-05-29 NOTE — Progress Notes (Signed)
Pt here today for pregnancy test.  Resulting positive.  Pt reports LMP 04/21/20 making EDD 01/26/2021 and 5w 3d today.  Pt reports that she had bright red bloody show x 1 yesterday when she wiped and this am when she wiped she had brownish color on tissue. Thressa Sheller, CNM notified.  I informed pt that no reason for concern however if she starts bleeding like a period to please go to MAU for evaluation or increased pain. Medications/allergies reviewed.  Patient encouraged to begin taking PNV.  Front office to provide proof of pregnancy letter.  Pt verbalized understanding with no further questions.  Addison Naegeli, RN  05/29/20

## 2020-05-29 NOTE — Progress Notes (Signed)
Patient was assessed and managed by nursing staff during this encounter. I have reviewed the chart and agree with the documentation and plan. I have also made any necessary editorial changes.  Thressa Sheller DNP, CNM  05/29/20  10:52 AM

## 2020-05-31 ENCOUNTER — Other Ambulatory Visit (HOSPITAL_COMMUNITY)
Admission: RE | Admit: 2020-05-31 | Discharge: 2020-05-31 | Disposition: A | Discharge status: Home / Self Care | Payer: Managed Care, Other (non HMO) | Source: Ambulatory Visit | Attending: Obstetrics and Gynecology | Admitting: Obstetrics and Gynecology

## 2020-05-31 ENCOUNTER — Other Ambulatory Visit: Payer: Self-pay

## 2020-05-31 ENCOUNTER — Ambulatory Visit (INDEPENDENT_AMBULATORY_CARE_PROVIDER_SITE_OTHER): Payer: Managed Care, Other (non HMO)

## 2020-05-31 VITALS — BP 125/70 | HR 89 | Wt 220.0 lb

## 2020-05-31 DIAGNOSIS — N898 Other specified noninflammatory disorders of vagina: Secondary | ICD-10-CM | POA: Insufficient documentation

## 2020-05-31 MED ORDER — METRONIDAZOLE 500 MG PO TABS: 500.0000 mg | ORAL_TABLET | Freq: Two times a day (BID) | ORAL | 0 refills | Status: DC

## 2020-05-31 NOTE — Progress Notes (Signed)
Pt here today with a complaint of vaginal discharge with odor. Pt is 5w 5d pregnant today. Pt reports some brown discharge with fishy odor. Reports odor has decreased today. Does not report any recent exposure to STD, but requests STD testing. Explained these symptoms sound like they may be caused by BV. Explained treatment for this is Flagyl 500 mg BID for 7 days; pt agreeable to plan. Explained our office will notify pt of any abnormal result or need for different treatment. Self-swab instructions given and specimen obtained. Flagyl sent to pt's preferred pharmacy per OB protocol.  Fleet Contras RN 05/31/20

## 2020-06-01 LAB — CERVICOVAGINAL ANCILLARY ONLY
Bacterial Vaginitis (gardnerella): POSITIVE — AB
Candida Glabrata: NEGATIVE
Candida Vaginitis: NEGATIVE
Chlamydia: NEGATIVE
Comment: NEGATIVE
Comment: NEGATIVE
Comment: NEGATIVE
Comment: NEGATIVE
Comment: NEGATIVE
Comment: NORMAL
Neisseria Gonorrhea: NEGATIVE
Trichomonas: NEGATIVE

## 2020-06-01 NOTE — Progress Notes (Signed)
Chart reviewed for nurse visit. Agree with plan of care.   Marylene Land, CNM 06/01/2020 10:11 AM

## 2020-06-25 ENCOUNTER — Telehealth: Payer: Self-pay | Admitting: Family Medicine

## 2020-06-25 NOTE — Telephone Encounter (Signed)
Called pt and discussed her concerns. She stated that when she got up this morning she felt lightheaded however the feeling did not last very long. She stated that she feels better now. Mostly she is just concerned because she has had a previous miscarriage. Pt also states that she is having some intermittent nausea. I advised pt to sit for a minute or so before getting up from laying down. Also she should nibble on saltine crackers frequently and not allow her stomach to become too empty. Pt was reassured that her sx are not related to miscarriage. She may discuss further with Dr. Donavan Foil @ [facility] Ob appt in 2 days. Pt voiced understanding and expressed gratitude for my call.

## 2020-06-25 NOTE — Telephone Encounter (Signed)
Pt called in and states that she is feeling like she will pass out and needs to talk to someone.

## 2020-06-27 ENCOUNTER — Ambulatory Visit (INDEPENDENT_AMBULATORY_CARE_PROVIDER_SITE_OTHER): Payer: Managed Care, Other (non HMO) | Admitting: Obstetrics and Gynecology

## 2020-06-27 ENCOUNTER — Other Ambulatory Visit (HOSPITAL_COMMUNITY)
Admission: RE | Admit: 2020-06-27 | Discharge: 2020-06-27 | Disposition: A | Discharge status: Home / Self Care | Payer: Managed Care, Other (non HMO) | Source: Ambulatory Visit | Attending: Obstetrics and Gynecology | Admitting: Obstetrics and Gynecology

## 2020-06-27 ENCOUNTER — Other Ambulatory Visit: Payer: Self-pay

## 2020-06-27 ENCOUNTER — Encounter: Payer: Self-pay | Admitting: Obstetrics and Gynecology

## 2020-06-27 VITALS — BP 114/78 | HR 125 | Wt 216.0 lb

## 2020-06-27 DIAGNOSIS — Z8751 Personal history of pre-term labor: Secondary | ICD-10-CM

## 2020-06-27 DIAGNOSIS — O021 Missed abortion: Secondary | ICD-10-CM | POA: Diagnosis not present

## 2020-06-27 DIAGNOSIS — O0990 Supervision of high risk pregnancy, unspecified, unspecified trimester: Secondary | ICD-10-CM

## 2020-06-27 DIAGNOSIS — Z3A09 9 weeks gestation of pregnancy: Secondary | ICD-10-CM | POA: Insufficient documentation

## 2020-06-27 LAB — POCT URINALYSIS DIP (DEVICE)
Bilirubin Urine: NEGATIVE
Glucose, UA: NEGATIVE mg/dL
Hgb urine dipstick: NEGATIVE
Ketones, ur: NEGATIVE mg/dL
Nitrite: NEGATIVE
Protein, ur: NEGATIVE mg/dL
Specific Gravity, Urine: 1.02 (ref 1.005–1.030)
Urobilinogen, UA: 1 mg/dL (ref 0.0–1.0)
pH: 7.5 (ref 5.0–8.0)

## 2020-06-27 LAB — OB RESULTS CONSOLE GBS: GBS: POSITIVE

## 2020-06-27 MED ORDER — BLOOD PRESSURE KIT DEVI: 1.0000 | 0 refills | Status: DC | PRN

## 2020-06-27 NOTE — Patient Instructions (Signed)
First Trimester of Pregnancy  The first trimester of pregnancy is from week 1 until the end of week 13 (months 1 through 3). During this time, your baby will begin to develop inside you. At 6-8 weeks, the eyes and face are formed, and the heartbeat can be seen on ultrasound. At the end of 12 weeks, all the baby's organs are formed. Prenatal care is all the medical care you receive before the birth of your baby. Make sure you get good prenatal care and follow all of your doctor's instructions. Follow these instructions at home: Medicines  Take over-the-counter and prescription medicines only as told by your doctor. Some medicines are safe and some medicines are not safe during pregnancy.  Take a prenatal vitamin that contains at least 600 micrograms (mcg) of folic acid.  If you have trouble pooping (constipation), take medicine that will make your stool soft (stool softener) if your doctor approves. Eating and drinking   Eat regular, healthy meals.  Your doctor will tell you the amount of weight gain that is right for you.  Avoid raw meat and uncooked cheese.  If you feel sick to your stomach (nauseous) or throw up (vomit): ? Eat 4 or 5 small meals a day instead of 3 large meals. ? Try eating a few soda crackers. ? Drink liquids between meals instead of during meals.  To prevent constipation: ? Eat foods that are high in fiber, like fresh fruits and vegetables, whole grains, and beans. ? Drink enough fluids to keep your pee (urine) clear or pale yellow. Activity  Exercise only as told by your doctor. Stop exercising if you have cramps or pain in your lower belly (abdomen) or low back.  Do not exercise if it is too hot, too humid, or if you are in a place of great height (high altitude).  Try to avoid standing for long periods of time. Move your legs often if you must stand in one place for a long time.  Avoid heavy lifting.  Wear low-heeled shoes. Sit and stand up  straight.  You can have sex unless your doctor tells you not to. Relieving pain and discomfort  Wear a good support bra if your breasts are sore.  Take warm water baths (sitz baths) to soothe pain or discomfort caused by hemorrhoids. Use hemorrhoid cream if your doctor says it is okay.  Rest with your legs raised if you have leg cramps or low back pain.  If you have puffy, bulging veins (varicose veins) in your legs: ? Wear support hose or compression stockings as told by your doctor. ? Raise (elevate) your feet for 15 minutes, 3-4 times a day. ? Limit salt in your food. Prenatal care  Schedule your prenatal visits by the twelfth week of pregnancy.  Write down your questions. Take them to your prenatal visits.  Keep all your prenatal visits as told by your doctor. This is important. Safety  Wear your seat belt at all times when driving.  Make a list of emergency phone numbers. The list should include numbers for family, friends, the hospital, and police and fire departments. General instructions  Ask your doctor for a referral to a local prenatal class. Begin classes no later than at the start of month 6 of your pregnancy.  Ask for help if you need counseling or if you need help with nutrition. Your doctor can give you advice or tell you where to go for help.  Do not use hot tubs, steam   rooms, or saunas.  Do not douche or use tampons or scented sanitary pads.  Do not cross your legs for long periods of time.  Avoid all herbs and alcohol. Avoid drugs that are not approved by your doctor.  Do not use any tobacco products, including cigarettes, chewing tobacco, and electronic cigarettes. If you need help quitting, ask your doctor. You may get counseling or other support to help you quit.  Avoid cat litter boxes and soil used by cats. These carry germs that can cause birth defects in the baby and can cause a loss of your baby (miscarriage) or stillbirth.  Visit your dentist.  At home, brush your teeth with a soft toothbrush. Be gentle when you floss. Contact a doctor if:  You are dizzy.  You have mild cramps or pressure in your lower belly.  You have a nagging pain in your belly area.  You continue to feel sick to your stomach, you throw up, or you have watery poop (diarrhea).  You have a bad smelling fluid coming from your vagina.  You have pain when you pee (urinate).  You have increased puffiness (swelling) in your face, hands, legs, or ankles. Get help right away if:  You have a fever.  You are leaking fluid from your vagina.  You have spotting or bleeding from your vagina.  You have very bad belly cramping or pain.  You gain or lose weight rapidly.  You throw up blood. It may look like coffee grounds.  You are around people who have German measles, fifth disease, or chickenpox.  You have a very bad headache.  You have shortness of breath.  You have any kind of trauma, such as from a fall or a car accident. Summary  The first trimester of pregnancy is from week 1 until the end of week 13 (months 1 through 3).  To take care of yourself and your unborn baby, you will need to eat healthy meals, take medicines only if your doctor tells you to do so, and do activities that are safe for you and your baby.  Keep all follow-up visits as told by your doctor. This is important as your doctor will have to ensure that your baby is healthy and growing well. This information is not intended to replace advice given to you by your health care provider. Make sure you discuss any questions you have with your health care provider. Document Revised: 11/25/2018 Document Reviewed: 08/12/2016 Elsevier Patient Education  2020 [employer]  

## 2020-06-27 NOTE — Progress Notes (Signed)
NEW OB packet provided Home Medicaid Form completed  Makena Rx faxed and received successful transmission

## 2020-06-27 NOTE — Addendum Note (Signed)
Addended by: Faythe Casa on: 06/27/2020 10:11 AM   Modules accepted: Orders

## 2020-06-27 NOTE — Progress Notes (Signed)
INITIAL PRENATAL VISIT NOTE  Subjective:  Jo Graham is a 34 y.o. [id number] at [redacted]w[redacted]d by sure LMP on 04/21/2020 being seen today for her initial prenatal visit. This is a planned pregnancy.  She was using nothing for birth control previously. She has an obstetric history significant for preterm delivery at 36 weeks, pt using makena in the second pregnancy and delivered at term. Pt also had recent SAB 01/2020.  She has a medical history significant for nothing.  Patient reports no complaints.  Contractions: Not present. Vag. Bleeding: None.   . Denies leaking of fluid.    Past Medical History:  Diagnosis Date  . H/O candidiasis   . H/O cystitis    x1    Past Surgical History:  Procedure Laterality Date  . DILATION AND EVACUATION N/A 01/25/2020   Procedure: DILATATION AND EVACUATION;  Surgeon: [place] Bing, MD;  Location: [facility];  Service: Gynecology;  Laterality: N/A;  . NO PAST SURGERIES      OB History  Gravida Para Term Preterm AB Living  5 2 1 1 2 2   SAB TAB Ectopic Multiple Live Births      1   2    # Outcome Date GA Lbr Len/2nd Weight Sex Delivery Anes PTL Lv  5 Current           4 AB 01/2020 [redacted]w[redacted]d         3 Ectopic 2019          2 Term 02/15/12 [redacted]w[redacted]d 13:33 / 01:12 7 lb 6 oz (3.345 kg) F Vag-Spont EPI  LIV     Birth Comments: Birthmark onright upper buttocks  1 Preterm 08/2007 [redacted]w[redacted]d 08:00 5 lb (2.268 kg) F Vag-Spont EPI Y LIV    Social History   Socioeconomic History  . Marital status: Single    Spouse name: Not on file  . Number of children: Not on file  . Years of education: Not on file  . Highest education level: Not on file  Occupational History  . Not on file  Tobacco Use  . Smoking status: Never Smoker  . Smokeless tobacco: Never Used  Vaping Use  . Vaping Use: Never used  Substance and Sexual Activity  . Alcohol use: No  . Drug use: No  . Sexual activity: Yes    Birth control/protection: None    Comment: currently pregnant    Other Topics Concern  . Not on file  Social History Narrative  . Not on file   Social Determinants of Health   Financial Resource Strain:   . Difficulty of Paying Living Expenses: Not on file  Food Insecurity: No Food Insecurity  . Worried About [redacted]w[redacted]d in the Last Year: Never true  . Ran Out of Food in the Last Year: Never true  Transportation Needs: No Transportation Needs  . Lack of Transportation (Medical): No  . Lack of Transportation (Non-Medical): No  Physical Activity:   . Days of Exercise per Week: Not on file  . Minutes of Exercise per Session: Not on file  Stress:   . Feeling of Stress : Not on file  Social Connections:   . Frequency of Communication with Friends and Family: Not on file  . Frequency of Social Gatherings with Friends and Family: Not on file  . Attends Religious Services: Not on file  . Active Member of Clubs or Organizations: Not on file  . Attends Programme researcher, broadcasting/film/video Meetings: Not on file  .  Marital Status: Not on file    Family History  Problem Relation Age of Onset  . Asthma Maternal Aunt   . Cancer Maternal Aunt      Current Outpatient Medications:  .  Prenatal Vit-Fe Fumarate-FA (MULTIVITAMIN-PRENATAL) 27-0.8 MG TABS tablet, Take 1 tablet by mouth daily at 12 noon. , Disp: , Rfl:   No Known Allergies  Review of Systems: Negative except for what is mentioned in HPI.  Objective:   Vitals:   06/27/20 0920  BP: 114/78  Pulse: (!) 125  Weight: 216 lb (98 kg)    Fetal Status:           Physical Exam: BP 114/78   Pulse (!) 125   Wt 216 lb (98 kg)   LMP 04/21/2020 (Exact Date)   BMI 40.81 kg/m  CONSTITUTIONAL: Well-developed, well-nourished female in no acute distress.  NEUROLOGIC: Alert and oriented to person, place, and time. Normal reflexes, muscle tone coordination. No cranial nerve deficit noted. PSYCHIATRIC: Normal mood and affect. Normal behavior. Normal judgment and thought content. SKIN: Skin is warm and  dry. No rash noted. Not diaphoretic. No erythema. No pallor. HENT:  Normocephalic, atraumatic, External right and left ear normal. Oropharynx is clear and moist EYES: Conjunctivae and EOM are normal.  NECK: Normal range of motion, supple, no masses CARDIOVASCULAR: Normal heart rate noted, regular rhythm RESPIRATORY: Effort and breath sounds normal, no problems with respiration noted BREASTS: symmetric, non-tender, no masses palpable ABDOMEN: Soft, nontender, nondistended, gravid. GU: normal appearing external female genitalia, multiparous normal appearing cervix, scant white discharge in vagina, no lesions noted, pap taken without incident Bimanual: 10 weeks sized uterus, no adnexal tenderness or palpable lesions noted MUSCULOSKELETAL: Normal range of motion. EXT:  No edema and no tenderness. 2+ distal pulses. Bedside u/s showed viable 9-10 week fetus with heartbeat  Assessment and Plan:  Pregnancy: [id number] at [redacted]w[redacted]d by sure LMP  1. Supervision of high risk pregnancy, antepartum  - Cytology - PAP( [facility]) - [facility] AMB BABYSCRIPTS SCHEDULE OPTIMIZATION - [place] MFM OB DETAIL +14 WK - CBC/D/Plt+RPR+Rh+ABO+Rub Ab... - Culture, OB Urine  2. IUFD at less than 20 weeks of gestation Routine monitoring  3. History of preterm delivery Pt will start makena at 16 weeks  4. 9 week pregnancy   Preterm labor symptoms and general obstetric precautions including but not limited to vaginal bleeding, contractions, leaking of fluid and fetal movement were reviewed in detail with the patient.  Please refer to After Visit Summary for other counseling recommendations.   Return in about 4 weeks (around 07/25/2020) for in person, [facility].  Warden Fillers 06/27/2020 10:05 AM

## 2020-06-28 ENCOUNTER — Encounter: Payer: Self-pay | Admitting: *Deleted

## 2020-06-28 LAB — CBC/D/PLT+RPR+RH+ABO+RUB AB...
Antibody Screen: NEGATIVE
Basophils Absolute: 0 10*3/uL (ref 0.0–0.2)
Basos: 0 %
EOS (ABSOLUTE): 0.1 10*3/uL (ref 0.0–0.4)
Eos: 2 %
HCV Ab: <0.1 s/co ratio (ref 0.0–0.9)
HIV Screen 4th Generation wRfx: NONREACTIVE
Hematocrit: 35.6 % (ref 34.0–46.6)
Hemoglobin: 11.3 g/dL (ref 11.1–15.9)
Hepatitis B Surface Ag: NEGATIVE
Immature Grans (Abs): 0 10*3/uL (ref 0.0–0.1)
Immature Granulocytes: 0 %
Lymphocytes Absolute: 1.3 10*3/uL (ref 0.7–3.1)
Lymphs: 18 %
MCH: 25.7 pg — ABNORMAL LOW (ref 26.6–33.0)
MCHC: 31.7 g/dL (ref 31.5–35.7)
MCV: 81 fL (ref 79–97)
Monocytes Absolute: 0.5 10*3/uL (ref 0.1–0.9)
Monocytes: 6 %
Neutrophils Absolute: 5.4 10*3/uL (ref 1.4–7.0)
Neutrophils: 74 %
Platelets: 337 10*3/uL (ref 150–450)
RBC: 4.4 x10E6/uL (ref 3.77–5.28)
RDW: 16.9 % — ABNORMAL HIGH (ref 11.7–15.4)
RPR Ser Ql: NONREACTIVE
Rh Factor: POSITIVE
Rubella Antibodies, IGG: 7.27 index (ref >0.99)
WBC: 7.3 10*3/uL (ref 3.4–10.8)

## 2020-06-28 LAB — HCV INTERPRETATION

## 2020-07-03 LAB — CYTOLOGY - PAP
Chlamydia: NEGATIVE
Comment: NEGATIVE
Comment: NEGATIVE
Comment: NORMAL
Diagnosis: NEGATIVE
High risk HPV: NEGATIVE
Neisseria Gonorrhea: NEGATIVE

## 2020-07-03 LAB — URINE CULTURE, OB REFLEX

## 2020-07-03 LAB — CULTURE, OB URINE

## 2020-07-04 ENCOUNTER — Telehealth: Payer: Self-pay | Admitting: Lactation Services

## 2020-07-04 ENCOUNTER — Other Ambulatory Visit: Payer: Self-pay | Admitting: Lactation Services

## 2020-07-04 MED ORDER — NITROFURANTOIN MONOHYD MACRO 100 MG PO CAPS: 100.0000 mg | ORAL_CAPSULE | Freq: Two times a day (BID) | ORAL | 0 refills | Status: DC

## 2020-07-04 NOTE — Telephone Encounter (Signed)
-----   Message from Warden Fillers, MD sent at 07/04/2020  9:57 AM EST ----- Subclinical asymptomatic UTI, treat with macrobid 100 mg po BID x 7 days, Pap normal

## 2020-07-04 NOTE — Telephone Encounter (Signed)
Ordered Macrobid per Dr. Donavan Foil.   Called patient and she did not answer. LM for patient that medication has been sent to pharmacy and to check her messages in My Chart. Advised to call the office for further questions or concerns.

## 2020-07-25 ENCOUNTER — Ambulatory Visit (INDEPENDENT_AMBULATORY_CARE_PROVIDER_SITE_OTHER): Payer: Managed Care, Other (non HMO) | Admitting: Women's Health

## 2020-07-25 ENCOUNTER — Other Ambulatory Visit: Payer: Self-pay

## 2020-07-25 ENCOUNTER — Encounter: Payer: Self-pay | Admitting: Women's Health

## 2020-07-25 VITALS — BP 119/54 | HR 98 | Wt 215.0 lb

## 2020-07-25 DIAGNOSIS — Z8751 Personal history of pre-term labor: Secondary | ICD-10-CM

## 2020-07-25 DIAGNOSIS — O021 Missed abortion: Secondary | ICD-10-CM

## 2020-07-25 DIAGNOSIS — O0990 Supervision of high risk pregnancy, unspecified, unspecified trimester: Secondary | ICD-10-CM

## 2020-07-25 NOTE — Patient Instructions (Signed)
Maternity Assessment Unit (MAU)  The Maternity Assessment Unit (MAU) is located at the [facility] at [facility]. The address is: [address] [place], [place], [place] [address]. Please see map below for additional directions.    The Maternity Assessment Unit is designed to help you during your pregnancy, and for up to 6 weeks after delivery, with any pregnancy- or postpartum-related emergencies, if you think you are in labor, or if your water has broken. For example, if you experience nausea and vomiting, vaginal bleeding, severe abdominal or pelvic pain, elevated blood pressure or other problems related to your pregnancy or postpartum time, please come to the Maternity Assessment Unit for assistance.        Second Trimester of Pregnancy The second trimester is from week 14 through week 27 (months 4 through 6). The second trimester is often a time when you feel your best. Your body has adjusted to being pregnant, and you begin to feel better physically. Usually, morning sickness has lessened or quit completely, you may have more energy, and you may have an increase in appetite. The second trimester is also a time when the fetus is growing rapidly. At the end of the sixth month, the fetus is about 9 inches long and weighs about 1 pounds. You will likely begin to feel the baby move (quickening) between 16 and 20 weeks of pregnancy. Body changes during your second trimester Your body continues to go through many changes during your second trimester. The changes vary from woman to woman.  Your weight will continue to increase. You will notice your lower abdomen bulging out.  You may begin to get stretch marks on your hips, abdomen, and breasts.  You may develop headaches that can be relieved by medicines. The medicines should be approved by your health care provider.  You may urinate more often because the fetus is pressing on your bladder.  You may  develop or continue to have heartburn as a result of your pregnancy.  You may develop constipation because certain hormones are causing the muscles that push waste through your intestines to slow down.  You may develop hemorrhoids or swollen, bulging veins (varicose veins).  You may have back pain. This is caused by: ? Weight gain. ? Pregnancy hormones that are relaxing the joints in your pelvis. ? A shift in weight and the muscles that support your balance.  Your breasts will continue to grow and they will continue to become tender.  Your gums may bleed and may be sensitive to brushing and flossing.  Dark spots or blotches (chloasma, mask of pregnancy) may develop on your face. This will likely fade after the baby is born.  A dark line from your belly button to the pubic area (linea nigra) may appear. This will likely fade after the baby is born.  You may have changes in your hair. These can include thickening of your hair, rapid growth, and changes in texture. Some women also have hair loss during or after pregnancy, or hair that feels dry or thin. Your hair will most likely return to normal after your baby is born. What to expect at prenatal visits During a routine prenatal visit:  You will be weighed to make sure you and the fetus are growing normally.  Your blood pressure will be taken.  Your abdomen will be measured to track your baby's growth.  The fetal heartbeat will be listened to.  Any test results from the previous visit  will be discussed. Your health care provider may ask you:  How you are feeling.  If you are feeling the baby move.  If you have had any abnormal symptoms, such as leaking fluid, bleeding, severe headaches, or abdominal cramping.  If you are using any tobacco products, including cigarettes, chewing tobacco, and electronic cigarettes.  If you have any questions. Other tests that may be performed during your second trimester include:  Blood tests  that check for: ? Low iron levels (anemia). ? High blood sugar that affects pregnant women (gestational diabetes) between 67 and 28 weeks. ? Rh antibodies. This is to check for a protein on red blood cells (Rh factor).  Urine tests to check for infections, diabetes, or protein in the urine.  An ultrasound to confirm the proper growth and development of the baby.  An amniocentesis to check for possible genetic problems.  Fetal screens for spina bifida and Down syndrome.  HIV (human immunodeficiency virus) testing. Routine prenatal testing includes screening for HIV, unless you choose not to have this test. Follow these instructions at home: Medicines  Follow your health care provider's instructions regarding medicine use. Specific medicines may be either safe or unsafe to take during pregnancy.  Take a prenatal vitamin that contains at least 600 micrograms (mcg) of folic acid.  If you develop constipation, try taking a stool softener if your health care provider approves. Eating and drinking   Eat a balanced diet that includes fresh fruits and vegetables, whole grains, good sources of protein such as meat, eggs, or tofu, and low-fat dairy. Your health care provider will help you determine the amount of weight gain that is right for you.  Avoid raw meat and uncooked cheese. These carry germs that can cause birth defects in the baby.  If you have low calcium intake from food, talk to your health care provider about whether you should take a daily calcium supplement.  Limit foods that are high in fat and processed sugars, such as fried and sweet foods.  To prevent constipation: ? Drink enough fluid to keep your urine clear or pale yellow. ? Eat foods that are high in fiber, such as fresh fruits and vegetables, whole grains, and beans. Activity  Exercise only as directed by your health care provider. Most women can continue their usual exercise routine during pregnancy. Try to  exercise for 30 minutes at least 5 days a week. Stop exercising if you experience uterine contractions.  Avoid heavy lifting, wear low heel shoes, and practice good posture.  A sexual relationship may be continued unless your health care provider directs you otherwise. Relieving pain and discomfort  Wear a good support bra to prevent discomfort from breast tenderness.  Take warm sitz baths to soothe any pain or discomfort caused by hemorrhoids. Use hemorrhoid cream if your health care provider approves.  Rest with your legs elevated if you have leg cramps or low back pain.  If you develop varicose veins, wear support hose. Elevate your feet for 15 minutes, 3-4 times a day. Limit salt in your diet. Prenatal Care  Write down your questions. Take them to your prenatal visits.  Keep all your prenatal visits as told by your health care provider. This is important. Safety  Wear your seat belt at all times when driving.  Make a list of emergency phone numbers, including numbers for family, friends, the hospital, and police and fire departments. General instructions  Ask your health care provider for a referral to a  local prenatal education class. Begin classes no later than the beginning of month 6 of your pregnancy.  Ask for help if you have counseling or nutritional needs during pregnancy. Your health care provider can offer advice or refer you to specialists for help with various needs.  Do not use hot tubs, steam rooms, or saunas.  Do not douche or use tampons or scented sanitary pads.  Do not cross your legs for long periods of time.  Avoid cat litter boxes and soil used by cats. These carry germs that can cause birth defects in the baby and possibly loss of the fetus by miscarriage or stillbirth.  Avoid all smoking, herbs, alcohol, and unprescribed drugs. Chemicals in these products can affect the formation and growth of the baby.  Do not use any products that contain nicotine  or tobacco, such as cigarettes and e-cigarettes. If you need help quitting, ask your health care provider.  Visit your dentist if you have not gone yet during your pregnancy. Use a soft toothbrush to brush your teeth and be gentle when you floss. Contact a health care provider if:  You have dizziness.  You have mild pelvic cramps, pelvic pressure, or nagging pain in the abdominal area.  You have persistent nausea, vomiting, or diarrhea.  You have a bad smelling vaginal discharge.  You have pain when you urinate. Get help right away if:  You have a fever.  You are leaking fluid from your vagina.  You have spotting or bleeding from your vagina.  You have severe abdominal cramping or pain.  You have rapid weight gain or weight loss.  You have shortness of breath with chest pain.  You notice sudden or extreme swelling of your face, hands, ankles, feet, or legs.  You have not felt your baby move in over an hour.  You have severe headaches that do not go away when you take medicine.  You have vision changes. Summary  The second trimester is from week 14 through week 27 (months 4 through 6). It is also a time when the fetus is growing rapidly.  Your body goes through many changes during pregnancy. The changes vary from woman to woman.  Avoid all smoking, herbs, alcohol, and unprescribed drugs. These chemicals affect the formation and growth your baby.  Do not use any tobacco products, such as cigarettes, chewing tobacco, and e-cigarettes. If you need help quitting, ask your health care provider.  Contact your health care provider if you have any questions. Keep all prenatal visits as told by your health care provider. This is important. This information is not intended to replace advice given to you by your health care provider. Make sure you discuss any questions you have with your health care provider. Document Revised: 11/26/2018 Document Reviewed: 09/09/2016 Elsevier  Patient Education  2020 Elsevier Inc.       Hydroxyprogesterone caproate injection for pregnancy What is this medicine? HYDROXYPROGESTERONE (hye drox ee proe JES ter one) is a female hormone. This medicine is used in women who are pregnant and who have delivered a baby too early (preterm) in the past. It helps lower the risk of having a preterm baby again. This medicine may be used for other purposes; ask your health care provider or pharmacist if you have questions. COMMON BRAND NAME(S): Makena What should I tell my health care provider before I take this medicine? They need to know if you have any of these conditions:  breast, cervical, uterine, or vaginal cancer  depression  diabetes or prediabetes  heart disease  high blood pressure  history of blood clots  kidney disease  liver disease  lung or breathing disease, like asthma  migraine headaches  seizures  vaginal bleeding  an unusual or allergic reaction to hydroxyprogesterone, other hormones, castor oil, benzyl alcohol, other medicines, foods, dyes, or preservatives  breast-feeding How should I use this medicine? This medicine is for injection into a muscle or under the skin. You will receive an injection once every week (every 7 days) as directed during your pregnancy. It is given by a health care professional in a hospital or clinic setting. Talk to your pediatrician regarding the use of this medicine in children. While this drug may be prescribed for pregnant women as young as 16 years, precautions do apply. Overdosage: If you think you have taken too much of this medicine contact a poison control center or emergency room at once. NOTE: This medicine is only for you. Do not share this medicine with others. What if I miss a dose? It is important not to miss your dose. Call your doctor or health care professional if you are unable to keep an appointment. What may interact with this medicine? Significant  interactions are not expected. This list may not describe all possible interactions. Give your health care provider a list of all the medicines, herbs, non-prescription drugs, or dietary supplements you use. Also tell them if you smoke, drink alcohol, or use illegal drugs. Some items may interact with your medicine. What should I watch for while using this medicine? Your pregnancy will be monitored carefully while you are receiving this medicine. What side effects may I notice from receiving this medicine? Side effects that you should report to your doctor or health care professional as soon as possible:  allergic reactions like skin rash, itching or hives, swelling of the face, lips, or tongue  breathing problems  depressed mood  increase in blood pressure  increased hunger or thirst  increased urination  signs and symptoms of a blood clot such as breathing problems; changes in vision; chest pain; severe, sudden headache; pain, swelling, warmth in the leg; trouble speaking; sudden numbness or weakness of the face, arm or leg  unusually weak or tired  unusual vaginal bleeding  yellowing of the eyes or skin Side effects that usually do not require medical attention (report to your doctor or health care professional if they continue or are bothersome):  diarrhea  fluid retention and swelling  nausea  pain, redness, or irritation at site where injected This list may not describe all possible side effects. Call your doctor for medical advice about side effects. You may report side effects to FDA at 1-800-FDA-1088. Where should I keep my medicine? This drug is given in a hospital or clinic and will not be stored at home. NOTE: This sheet is a summary. It may not cover all possible information. If you have questions about this medicine, talk to your doctor, pharmacist, or health care provider.  2020 Elsevier/Gold Standard (2017-04-19 11:14:47)

## 2020-07-25 NOTE — Progress Notes (Signed)
Demonstrated makena auto injector usage and reviewed instructions with patient. Patient will be receiving 17p injections at home.

## 2020-07-25 NOTE — Progress Notes (Signed)
Subjective:  Jo Graham is a 34 y.o. [id number] at [redacted]w[redacted]d being seen today for ongoing prenatal care.  She is currently monitored for the following issues for this high-risk pregnancy and has History of preterm delivery; Supervision of high risk pregnancy, antepartum; and IUFD at less than 20 weeks of gestation on their problem list.  Patient reports no complaints.  Contractions: Not present. Vag. Bleeding: None.  Movement: Absent. Denies leaking of fluid.   The following portions of the patient's history were reviewed and updated as appropriate: allergies, current medications, past family history, past medical history, past social history, past surgical history and problem list. Problem list updated.  Objective:   Vitals:   07/25/20 1112  BP: (!) 119/54  Pulse: 98  Weight: 215 lb (97.5 kg)    Fetal Status: Fetal Heart Rate (bpm): 164   Movement: Absent     General:  Alert, oriented and cooperative. Patient is in no acute distress.  Skin: Skin is warm and dry. No rash noted.   Cardiovascular: Normal heart rate noted  Respiratory: Normal respiratory effort, no problems with respiration noted  Abdomen: Soft, gravid, appropriate for gestational age. Pain/Pressure: Absent     Pelvic: Vag. Bleeding: None     Cervical exam deferred        Extremities: Normal range of motion.  Edema: None  Mental Status: Normal mood and affect. Normal behavior. Normal judgment and thought content.   Urinalysis:      Assessment and Plan:  Pregnancy: [id number] at [redacted]w[redacted]d  1. Supervision of high risk pregnancy, antepartum - Genetic Screening -AFP next visit  2. History of preterm delivery - Makena teaching done today and ordered  3. IUFD at less than 20 weeks of gestation - 9 weeks  Preterm labor symptoms and general obstetric precautions including but not limited to vaginal bleeding, contractions, leaking of fluid and fetal movement were reviewed in detail with the patient. Please refer to After Visit  Summary for other counseling recommendations.  Return in about 4 weeks (around 08/22/2020) for [facility] APP OK, needs anatomy scan appt.   Aiesha Leland, Odie Sera, NP

## 2020-07-26 ENCOUNTER — Telehealth: Payer: Self-pay | Admitting: Family Medicine

## 2020-07-26 NOTE — Telephone Encounter (Signed)
Cruzita Lederer Called [phone] [facility])

## 2020-07-30 ENCOUNTER — Telehealth: Payer: Self-pay | Admitting: *Deleted

## 2020-07-30 NOTE — Telephone Encounter (Signed)
VM message left by [facility] requesting the status of pt's prior auth for [facility]. Please call [phone] ext [phone].

## 2020-08-02 ENCOUNTER — Encounter: Payer: Self-pay | Admitting: *Deleted

## 2020-08-02 NOTE — Telephone Encounter (Signed)
Called Jo Graham and was informed prescription required a PA and was provided [facility] [employer]'s phone number [phone]. Called [employer] who ran prescription again with different information and PA was not required. [employer] informed me patient should call to schedule shipment.   Called & informed patient and provided [employer] phone number. Patient verbalized understanding.

## 2020-08-03 ENCOUNTER — Telehealth: Payer: Self-pay | Admitting: Family Medicine

## 2020-08-03 NOTE — Telephone Encounter (Signed)
Patient said she was told by a nurse that she could come in a hear the baby heart beat, want to come in on Monday if possible

## 2020-08-07 ENCOUNTER — Telehealth: Payer: Self-pay | Admitting: Family Medicine

## 2020-08-07 NOTE — Telephone Encounter (Signed)
Patient is requesting a call back so that she may speak to someone about her FMLA paperwork.

## 2020-08-08 ENCOUNTER — Telehealth: Payer: Self-pay | Admitting: *Deleted

## 2020-08-08 NOTE — Telephone Encounter (Signed)
Patient requested a call to discuss her FMLA paperwork.  Patient states the leave from November was cancelled and we should have received additional paperwork to complete around 07/19/20.  I explained to patient the paperwork we had from the [employer] was dated early November.  Her release of information was signed by her on 07/11/20.  Paperwork was completed and faxed on 07/23/20.  Patient states she will reach out to the company to see if they can accept what has already been sent.  Patient will call us back if she needs additional paperwork.

## 2020-08-18 NOTE — L&D Delivery Note (Addendum)
OB/GYN Faculty Practice Delivery Note  Jo Graham is a 35 y.o. [id number] s/p SVD at [redacted]w[redacted]d. She was admitted for IOL for SGA.   ROM: 0h 34m with clear fluid GBS Status: Negative/-- (05/19 1416) Maximum Maternal Temperature: 98.1 F  Labor Progress: . Patient presented to L&D for IOL. Initial SVE: 1/50/-3. Labor course was uncomplicated. She received two doses of cytotec and a foley bulb and then progressed to complete. She was AROM'd and delivered after 1.5 pushes.  Delivery Date/Time: 01/20/2021 0340 Delivery: Called to room and patient was complete and pushing. Head position was OA and delivered with ease over the perineum. Delivered through a loose nuchal cord. Shoulder and body delivered in usual fashion. Infant with spontaneous cry, placed on mother's abdomen, dried and stimulated. Cord clamped x 2 after 1-minute delay, and cut by FOB. Cord blood drawn. Placenta delivered spontaneously with gentle cord traction. Fundus firm with massage and pitocin started. Labia, perineum, vagina, and cervix inspected without any lacerations .  Baby Weight: pending  Cord: central insertion, 3 vessel Placenta: Sent to L&D Complications: None Lacerations: none EBL: 200cc Analgesia: Epidural   Infant: APGAR (1 MIN): 8   APGAR (5 MINS): 9    Will Weisgerber MD, PGY-1 OBGYN Faculty Teaching Service  01/20/2021, 4:03 AM   I was present and gloved for the entire delivery. I agree with the findings and the plan of care as documented in the resident's note.  Casper Harrison, MD [facility] Fellow, [facility] for [facility], [facility]

## 2020-08-22 ENCOUNTER — Ambulatory Visit (INDEPENDENT_AMBULATORY_CARE_PROVIDER_SITE_OTHER): Payer: Managed Care, Other (non HMO) | Admitting: Family Medicine

## 2020-08-22 ENCOUNTER — Other Ambulatory Visit: Payer: Self-pay

## 2020-08-22 ENCOUNTER — Encounter: Payer: Self-pay | Admitting: Family Medicine

## 2020-08-22 VITALS — BP 101/70 | HR 109 | Wt 204.8 lb

## 2020-08-22 DIAGNOSIS — Z8751 Personal history of pre-term labor: Secondary | ICD-10-CM

## 2020-08-22 DIAGNOSIS — O0990 Supervision of high risk pregnancy, unspecified, unspecified trimester: Secondary | ICD-10-CM

## 2020-08-22 DIAGNOSIS — R8271 Bacteriuria: Secondary | ICD-10-CM

## 2020-08-22 DIAGNOSIS — O99891 Other specified diseases and conditions complicating pregnancy: Secondary | ICD-10-CM

## 2020-08-22 DIAGNOSIS — O021 Missed abortion: Secondary | ICD-10-CM

## 2020-08-22 NOTE — Patient Instructions (Signed)
 Contraception Choices Contraception, also called birth control, refers to methods or devices that prevent pregnancy. Hormonal methods Contraceptive implant  A contraceptive implant is a thin, plastic tube that contains a hormone. It is inserted into the upper part of the arm. It can remain in place for up to 3 years. Progestin-only injections Progestin-only injections are injections of progestin, a synthetic form of the hormone progesterone. They are given every 3 months by a health care provider. Birth control pills  Birth control pills are pills that contain hormones that prevent pregnancy. They must be taken once a day, preferably at the same time each day. Birth control patch  The birth control patch contains hormones that prevent pregnancy. It is placed on the skin and must be changed once a week for three weeks and removed on the fourth week. A prescription is needed to use this method of contraception. Vaginal ring  A vaginal ring contains hormones that prevent pregnancy. It is placed in the vagina for three weeks and removed on the fourth week. After that, the process is repeated with a new ring. A prescription is needed to use this method of contraception. Emergency contraceptive Emergency contraceptives prevent pregnancy after unprotected sex. They come in pill form and can be taken up to 5 days after sex. They work best the sooner they are taken after having sex. Most emergency contraceptives are available without a prescription. This method should not be used as your only form of birth control. Barrier methods Female condom  A female condom is a thin sheath that is worn over the penis during sex. Condoms keep sperm from going inside a woman's body. They can be used with a spermicide to increase their effectiveness. They should be disposed after a single use. Female condom  A female condom is a soft, loose-fitting sheath that is put into the vagina before sex. The condom keeps  sperm from going inside a woman's body. They should be disposed after a single use. Diaphragm  A diaphragm is a soft, dome-shaped barrier. It is inserted into the vagina before sex, along with a spermicide. The diaphragm blocks sperm from entering the uterus, and the spermicide kills sperm. A diaphragm should be left in the vagina for 6-8 hours after sex and removed within 24 hours. A diaphragm is prescribed and fitted by a health care provider. A diaphragm should be replaced every 1-2 years, after giving birth, after gaining more than 15 lb (6.8 kg), and after pelvic surgery. Cervical cap  A cervical cap is a round, soft latex or plastic cup that fits over the cervix. It is inserted into the vagina before sex, along with spermicide. It blocks sperm from entering the uterus. The cap should be left in place for 6-8 hours after sex and removed within 48 hours. A cervical cap must be prescribed and fitted by a health care provider. It should be replaced every 2 years. Sponge  A sponge is a soft, circular piece of polyurethane foam with spermicide on it. The sponge helps block sperm from entering the uterus, and the spermicide kills sperm. To use it, you make it wet and then insert it into the vagina. It should be inserted before sex, left in for at least 6 hours after sex, and removed and thrown away within 30 hours. Spermicides Spermicides are chemicals that kill or block sperm from entering the cervix and uterus. They can come as a cream, jelly, suppository, foam, or tablet. A spermicide should be inserted into   the vagina with an applicator at least 10-15 minutes before sex to allow time for it to work. The process must be repeated every time you have sex. Spermicides do not require a prescription. Intrauterine contraception Intrauterine device (IUD) An IUD is a T-shaped device that is put in a woman's uterus. There are two types:  Hormone IUD.This type contains progestin, a synthetic form of the  hormone progesterone. This type can stay in place for 3-5 years.  Copper IUD.This type is wrapped in copper wire. It can stay in place for 10 years.  Permanent methods of contraception Female tubal ligation In this method, a woman's fallopian tubes are sealed, tied, or blocked during surgery to prevent eggs from traveling to the uterus. Hysteroscopic sterilization In this method, a small, flexible insert is placed into each fallopian tube. The inserts cause scar tissue to form in the fallopian tubes and block them, so sperm cannot reach an egg. The procedure takes about 3 months to be effective. Another form of birth control must be used during those 3 months. Female sterilization This is a procedure to tie off the tubes that carry sperm (vasectomy). After the procedure, the man can still ejaculate fluid (semen). Natural planning methods Natural family planning In this method, a couple does not have sex on days when the woman could become pregnant. Calendar method This means keeping track of the length of each menstrual cycle, identifying the days when pregnancy can happen, and not having sex on those days. Ovulation method In this method, a couple avoids sex during ovulation. Symptothermal method This method involves not having sex during ovulation. The woman typically checks for ovulation by watching changes in her temperature and in the consistency of cervical mucus. Post-ovulation method In this method, a couple waits to have sex until after ovulation. Summary  Contraception, also called birth control, means methods or devices that prevent pregnancy.  Hormonal methods of contraception include implants, injections, pills, patches, vaginal rings, and emergency contraceptives.  Barrier methods of contraception can include female condoms, female condoms, diaphragms, cervical caps, sponges, and spermicides.  There are two types of IUDs (intrauterine devices). An IUD can be put in a woman's  uterus to prevent pregnancy for 3-5 years.  Permanent sterilization can be done through a procedure for males, females, or both.  Natural family planning methods involve not having sex on days when the woman could become pregnant. This information is not intended to replace advice given to you by your health care provider. Make sure you discuss any questions you have with your health care provider. Document Revised: 08/06/2017 Document Reviewed: 09/06/2016 Elsevier Patient Education  2020 [employer] Inc[employer]   Breastfeeding  Choosing to breastfeed is one of the best decisions you can make for yourself and your baby. A change in hormones during pregnancy causes your breasts to make breast milk in your milk-producing glands. Hormones prevent breast milk from being released before your baby is born. They also prompt milk flow after birth. Once breastfeeding has begun, thoughts of your baby, as well as his or her sucking or crying, can stimulate the release of milk from your milk-producing glands. Benefits of breastfeeding Research shows that breastfeeding offers many health benefits for infants and mothers. It also offers a cost-free and convenient way to feed your baby. For your baby  Your first milk (colostrum) helps your baby's digestive system to function better.  Special cells in your milk (antibodies) help your baby to fight off infections.  Breastfed babies are   less likely to develop asthma, allergies, obesity, or type 2 diabetes. They are also at lower risk for sudden infant death syndrome (SIDS).  Nutrients in breast milk are better able to meet your baby's needs compared to infant formula.  Breast milk improves your baby's brain development. For you  Breastfeeding helps to create a very special bond between you and your baby.  Breastfeeding is convenient. Breast milk costs nothing and is always available at the correct temperature.  Breastfeeding helps to burn calories. It helps you  to lose the weight that you gained during pregnancy.  Breastfeeding makes your uterus return faster to its size before pregnancy. It also slows bleeding (lochia) after you give birth.  Breastfeeding helps to lower your risk of developing type 2 diabetes, osteoporosis, rheumatoid arthritis, cardiovascular disease, and breast, ovarian, uterine, and endometrial cancer later in life. Breastfeeding basics Starting breastfeeding  Find a comfortable place to sit or lie down, with your neck and back well-supported.  Place a pillow or a rolled-up blanket under your baby to bring him or her to the level of your breast (if you are seated). Nursing pillows are specially designed to help support your arms and your baby while you breastfeed.  Make sure that your baby's tummy (abdomen) is facing your abdomen.  Gently massage your breast. With your fingertips, massage from the outer edges of your breast inward toward the nipple. This encourages milk flow. If your milk flows slowly, you may need to continue this action during the feeding.  Support your breast with 4 fingers underneath and your thumb above your nipple (make the letter "C" with your hand). Make sure your fingers are well away from your nipple and your baby's mouth.  Stroke your baby's lips gently with your finger or nipple.  When your baby's mouth is open wide enough, quickly bring your baby to your breast, placing your entire nipple and as much of the areola as possible into your baby's mouth. The areola is the colored area around your nipple. ? More areola should be visible above your baby's upper lip than below the lower lip. ? Your baby's lips should be opened and extended outward (flanged) to ensure an adequate, comfortable latch. ? Your baby's tongue should be between his or her lower gum and your breast.  Make sure that your baby's mouth is correctly positioned around your nipple (latched). Your baby's lips should create a seal on your  breast and be turned out (everted).  It is common for your baby to suck about 2-3 minutes in order to start the flow of breast milk. Latching Teaching your baby how to latch onto your breast properly is very important. An improper latch can cause nipple pain, decreased milk supply, and poor weight gain in your baby. Also, if your baby is not latched onto your nipple properly, he or she may swallow some air during feeding. This can make your baby fussy. Burping your baby when you switch breasts during the feeding can help to get rid of the air. However, teaching your baby to latch on properly is still the best way to prevent fussiness from swallowing air while breastfeeding. Signs that your baby has successfully latched onto your nipple  Silent tugging or silent sucking, without causing you pain. Infant's lips should be extended outward (flanged).  Swallowing heard between every 3-4 sucks once your milk has started to flow (after your let-down milk reflex occurs).  Muscle movement above and in front of his or her   ears while sucking. Signs that your baby has not successfully latched onto your nipple  Sucking sounds or smacking sounds from your baby while breastfeeding.  Nipple pain. If you think your baby has not latched on correctly, slip your finger into the corner of your baby's mouth to break the suction and place it between your baby's gums. Attempt to start breastfeeding again. Signs of successful breastfeeding Signs from your baby  Your baby will gradually decrease the number of sucks or will completely stop sucking.  Your baby will fall asleep.  Your baby's body will relax.  Your baby will retain a small amount of milk in his or her mouth.  Your baby will let go of your breast by himself or herself. Signs from you  Breasts that have increased in firmness, weight, and size 1-3 hours after feeding.  Breasts that are softer immediately after breastfeeding.  Increased milk  volume, as well as a change in milk consistency and color by the fifth day of breastfeeding.  Nipples that are not sore, cracked, or bleeding. Signs that your baby is getting enough milk  Wetting at least 1-2 diapers during the first 24 hours after birth.  Wetting at least 5-6 diapers every 24 hours for the first week after birth. The urine should be clear or pale yellow by the age of 5 days.  Wetting 6-8 diapers every 24 hours as your baby continues to grow and develop.  At least 3 stools in a 24-hour period by the age of 5 days. The stool should be soft and yellow.  At least 3 stools in a 24-hour period by the age of 7 days. The stool should be seedy and yellow.  No loss of weight greater than 10% of birth weight during the first 3 days of life.  Average weight gain of 4-7 oz (113-198 g) per week after the age of 4 days.  Consistent daily weight gain by the age of 5 days, without weight loss after the age of 2 weeks. After a feeding, your baby may spit up a small amount of milk. This is normal. Breastfeeding frequency and duration Frequent feeding will help you make more milk and can prevent sore nipples and extremely full breasts (breast engorgement). Breastfeed when you feel the need to reduce the fullness of your breasts or when your baby shows signs of hunger. This is called "breastfeeding on demand." Signs that your baby is hungry include:  Increased alertness, activity, or restlessness.  Movement of the head from side to side.  Opening of the mouth when the corner of the mouth or cheek is stroked (rooting).  Increased sucking sounds, smacking lips, cooing, sighing, or squeaking.  Hand-to-mouth movements and sucking on fingers or hands.  Fussing or crying. Avoid introducing a pacifier to your baby in the first 4-6 weeks after your baby is born. After this time, you may choose to use a pacifier. Research has shown that pacifier use during the first year of a baby's life  decreases the risk of sudden infant death syndrome (SIDS). Allow your baby to feed on each breast as long as he or she wants. When your baby unlatches or falls asleep while feeding from the first breast, offer the second breast. Because newborns are often sleepy in the first few weeks of life, you may need to awaken your baby to get him or her to feed. Breastfeeding times will vary from baby to baby. However, the following rules can serve as a guide to   help you make sure that your baby is properly fed:  Newborns (babies 4 weeks of age or younger) may breastfeed every 1-3 hours.  Newborns should not go without breastfeeding for longer than 3 hours during the day or 5 hours during the night.  You should breastfeed your baby a minimum of 8 times in a 24-hour period. Breast milk pumping     Pumping and storing breast milk allows you to make sure that your baby is exclusively fed your breast milk, even at times when you are unable to breastfeed. This is especially important if you go back to work while you are still breastfeeding, or if you are not able to be present during feedings. Your lactation consultant can help you find a method of pumping that works best for you and give you guidelines about how long it is safe to store breast milk. Caring for your breasts while you breastfeed Nipples can become dry, cracked, and sore while breastfeeding. The following recommendations can help keep your breasts moisturized and healthy:  Avoid using soap on your nipples.  Wear a supportive bra designed especially for nursing. Avoid wearing underwire-style bras or extremely tight bras (sports bras).  Air-dry your nipples for 3-4 minutes after each feeding.  Use only cotton bra pads to absorb leaked breast milk. Leaking of breast milk between feedings is normal.  Use lanolin on your nipples after breastfeeding. Lanolin helps to maintain your skin's normal moisture barrier. Pure lanolin is not harmful (not  toxic) to your baby. You may also hand express a few drops of breast milk and gently massage that milk into your nipples and allow the milk to air-dry. In the first few weeks after giving birth, some women experience breast engorgement. Engorgement can make your breasts feel heavy, warm, and tender to the touch. Engorgement peaks within 3-5 days after you give birth. The following recommendations can help to ease engorgement:  Completely empty your breasts while breastfeeding or pumping. You may want to start by applying warm, moist heat (in the shower or with warm, water-soaked hand towels) just before feeding or pumping. This increases circulation and helps the milk flow. If your baby does not completely empty your breasts while breastfeeding, pump any extra milk after he or she is finished.  Apply ice packs to your breasts immediately after breastfeeding or pumping, unless this is too uncomfortable for you. To do this: ? Put ice in a plastic bag. ? Place a towel between your skin and the bag. ? Leave the ice on for 20 minutes, 2-3 times a day.  Make sure that your baby is latched on and positioned properly while breastfeeding. If engorgement persists after 48 hours of following these recommendations, contact your health care provider or a lactation consultant. Overall health care recommendations while breastfeeding  Eat 3 healthy meals and 3 snacks every day. Well-nourished mothers who are breastfeeding need an additional 450-500 calories a day. You can meet this requirement by increasing the amount of a balanced diet that you eat.  Drink enough water to keep your urine pale yellow or clear.  Rest often, relax, and continue to take your prenatal vitamins to prevent fatigue, stress, and low vitamin and mineral levels in your body (nutrient deficiencies).  Do not use any products that contain nicotine or tobacco, such as cigarettes and e-cigarettes. Your baby may be harmed by chemicals from  cigarettes that pass into breast milk and exposure to secondhand smoke. If you need help quitting, ask your   health care provider.  Avoid alcohol.  Do not use illegal drugs or marijuana.  Talk with your health care provider before taking any medicines. These include over-the-counter and prescription medicines as well as vitamins and herbal supplements. Some medicines that may be harmful to your baby can pass through breast milk.  It is possible to become pregnant while breastfeeding. If birth control is desired, ask your health care provider about options that will be safe while breastfeeding your baby. Where to find more information: [employer]: www.llli.org Contact a health care provider if:  You feel like you want to stop breastfeeding or have become frustrated with breastfeeding.  Your nipples are cracked or bleeding.  Your breasts are red, tender, or warm.  You have: ? Painful breasts or nipples. ? A swollen area on either breast. ? A fever or chills. ? Nausea or vomiting. ? Drainage other than breast milk from your nipples.  Your breasts do not become full before feedings by the fifth day after you give birth.  You feel sad and depressed.  Your baby is: ? Too sleepy to eat well. ? Having trouble sleeping. ? More than 1 week old and wetting fewer than 6 diapers in a 24-hour period. ? Not gaining weight by 5 days of age.  Your baby has fewer than 3 stools in a 24-hour period.  Your baby's skin or the white parts of his or her eyes become yellow. Get help right away if:  Your baby is overly tired (lethargic) and does not want to wake up and feed.  Your baby develops an unexplained fever. Summary  Breastfeeding offers many health benefits for infant and mothers.  Try to breastfeed your infant when he or she shows early signs of hunger.  Gently tickle or stroke your baby's lips with your finger or nipple to allow the baby to open his or her mouth.  Bring the baby to your breast. Make sure that much of the areola is in your baby's mouth. Offer one side and burp the baby before you offer the other side.  Talk with your health care provider or lactation consultant if you have questions or you face problems as you breastfeed. This information is not intended to replace advice given to you by your health care provider. Make sure you discuss any questions you have with your health care provider. Document Revised: 10/29/2017 Document Reviewed: 09/05/2016 Elsevier Patient Education  2020 Elsevier Inc.  

## 2020-08-22 NOTE — Progress Notes (Signed)
   Subjective:  Jo Graham is a 35 y.o. [id number] at [redacted]w[redacted]d being seen today for ongoing prenatal care.  She is currently monitored for the following issues for this high-risk pregnancy and has History of preterm delivery; Supervision of high risk pregnancy, antepartum; and IUFD at less than 20 weeks of gestation on their problem list.  Patient reports fatigue.  Contractions: Not present. Vag. Bleeding: None.  Movement: Present. Denies leaking of fluid.   The following portions of the patient's history were reviewed and updated as appropriate: allergies, current medications, past family history, past medical history, past social history, past surgical history and problem list. Problem list updated.  Objective:   Vitals:   08/22/20 0956  BP: 101/70  Pulse: (!) 109  Weight: 204 lb 12.8 oz (92.9 kg)    Fetal Status: Fetal Heart Rate (bpm): 160   Movement: Present     General:  Alert, oriented and cooperative. Patient is in no acute distress.  Skin: Skin is warm and dry. No rash noted.   Cardiovascular: Normal heart rate noted  Respiratory: Normal respiratory effort, no problems with respiration noted  Abdomen: Soft, gravid, appropriate for gestational age. Pain/Pressure: Absent     Pelvic: Vag. Bleeding: None     Cervical exam deferred        Extremities: Normal range of motion.  Edema: None  Mental Status: Normal mood and affect. Normal behavior. Normal judgment and thought content.   Urinalysis:      Assessment and Plan:  Pregnancy: [id number] at [redacted]w[redacted]d  1. Supervision of high risk pregnancy, antepartum BP and FHR normal Will schedule for anatomy scan today AFP today Discussed contraception, does not want more children, referred to bedsider, considering tubal but needs time to think - AFP, Serum, Open Spina Bifida - [place] [facility]FM OB DETAIL +14 WK; Future  2. Asymptomatic bacteriuria during pregnancy Treated after new OB, TOC today - Culture, OB Urine  3. IUFD at less than 20 weeks  of gestation 12 wk SAB, does not meet criteria for antenatal testing  4. History of preterm delivery Taking Makena since last week  Preterm labor symptoms and general obstetric precautions including but not limited to vaginal bleeding, contractions, leaking of fluid and fetal movement were reviewed in detail with the patient. Please refer to After Visit Summary for other counseling recommendations.  Return in 4 weeks (on 09/19/2020).   Venora Maples, MD

## 2020-08-24 LAB — AFP, SERUM, OPEN SPINA BIFIDA
AFP MoM: 0.83
AFP Value: 31.5 ng/mL
Gest. Age on Collection Date: 17.6 weeks
Maternal Age At EDD: 32.6 yr
OSBR Risk 1 IN: 10000
Test Results:: NEGATIVE
Weight: 205 [lb_av]

## 2020-08-24 LAB — CULTURE, OB URINE

## 2020-08-24 LAB — URINE CULTURE, OB REFLEX

## 2020-08-27 ENCOUNTER — Telehealth: Payer: Self-pay | Admitting: Family Medicine

## 2020-08-27 NOTE — Telephone Encounter (Signed)
Patient calling to speak with someone about the shot she gave herself, state she is swollen (Makena)

## 2020-08-27 NOTE — Telephone Encounter (Signed)
Returned call to patient. Patient reports she gave her Makena on Saturday and the area is swollen, itchy, and firm. This is her 3rd dose.   Reviewed using warm compresses to the area for the next few day and can take 2 regular Tylenol every 4 hours or 2 extra strength Tylenol every 6 hours as needed. Patient voiced understanding.

## 2020-09-05 ENCOUNTER — Encounter: Payer: Self-pay | Admitting: *Deleted

## 2020-09-05 ENCOUNTER — Ambulatory Visit: Payer: Managed Care, Other (non HMO) | Admitting: *Deleted

## 2020-09-05 ENCOUNTER — Other Ambulatory Visit: Payer: Self-pay | Admitting: *Deleted

## 2020-09-05 ENCOUNTER — Other Ambulatory Visit: Payer: Self-pay

## 2020-09-05 ENCOUNTER — Telehealth: Payer: Self-pay | Admitting: Family Medicine

## 2020-09-05 ENCOUNTER — Ambulatory Visit: Payer: Managed Care, Other (non HMO) | Attending: Women's Health

## 2020-09-05 VITALS — BP 123/69 | HR 102

## 2020-09-05 DIAGNOSIS — O0990 Supervision of high risk pregnancy, unspecified, unspecified trimester: Secondary | ICD-10-CM | POA: Diagnosis present

## 2020-09-05 DIAGNOSIS — Z8759 Personal history of other complications of pregnancy, childbirth and the puerperium: Secondary | ICD-10-CM | POA: Insufficient documentation

## 2020-09-05 DIAGNOSIS — R638 Other symptoms and signs concerning food and fluid intake: Secondary | ICD-10-CM

## 2020-09-05 NOTE — Telephone Encounter (Signed)
Patient have questions about her [facility]

## 2020-09-07 NOTE — Telephone Encounter (Signed)
Called pt; pt states she contacted [facility] earlier in the week to request an additional shipment but was declined due to prior authorization expiration. Called [facility]. Medicaid information not on file. Gave Medicaid information. Pharmacy will rerun medication to see if this will be covered without prior auth. They stated if this will be covered they will contact the patient today to verify shipment information. If medicaid declines coverage, a prior authorization will be covered. I explained I will follow up with pt on Monday when our office reopened to assess for need of PA.

## 2020-09-08 ENCOUNTER — Encounter: Payer: Self-pay | Admitting: Women's Health

## 2020-09-08 DIAGNOSIS — O35DXX0 Maternal care for other (suspected) fetal abnormality and damage, fetal gastrointestinal anomalies, not applicable or unspecified: Secondary | ICD-10-CM | POA: Insufficient documentation

## 2020-09-08 DIAGNOSIS — O358XX0 Maternal care for other (suspected) fetal abnormality and damage, not applicable or unspecified: Secondary | ICD-10-CM | POA: Insufficient documentation

## 2020-09-11 NOTE — Telephone Encounter (Signed)
Called pt; pt states she was not contacted by pharmacy over the weekend.

## 2020-09-12 NOTE — Telephone Encounter (Signed)
Call received from [place] @ [facility]. She stated that the [facility] auto injector Rx requires prior authorization. [facility] can be reached @ [phone].

## 2020-09-18 ENCOUNTER — Encounter: Payer: Self-pay | Admitting: General Practice

## 2020-09-18 ENCOUNTER — Encounter: Payer: Self-pay | Admitting: Family Medicine

## 2020-09-18 NOTE — Telephone Encounter (Signed)
Called to submit PA through [place] at [phone]. PA #[id number], will take processing up to 72 hours. Sent mychart message to patient.

## 2020-09-20 ENCOUNTER — Other Ambulatory Visit: Payer: Self-pay

## 2020-09-20 ENCOUNTER — Ambulatory Visit (INDEPENDENT_AMBULATORY_CARE_PROVIDER_SITE_OTHER): Payer: Managed Care, Other (non HMO) | Admitting: Family Medicine

## 2020-09-20 VITALS — BP 116/70 | HR 105 | Wt 204.0 lb

## 2020-09-20 DIAGNOSIS — O0990 Supervision of high risk pregnancy, unspecified, unspecified trimester: Secondary | ICD-10-CM

## 2020-09-20 DIAGNOSIS — O358XX0 Maternal care for other (suspected) fetal abnormality and damage, not applicable or unspecified: Secondary | ICD-10-CM

## 2020-09-20 DIAGNOSIS — Z8751 Personal history of pre-term labor: Secondary | ICD-10-CM

## 2020-09-20 DIAGNOSIS — O021 Missed abortion: Secondary | ICD-10-CM

## 2020-09-20 DIAGNOSIS — O35DXX0 Maternal care for other (suspected) fetal abnormality and damage, fetal gastrointestinal anomalies, not applicable or unspecified: Secondary | ICD-10-CM

## 2020-09-20 NOTE — Patient Instructions (Signed)

## 2020-09-20 NOTE — Progress Notes (Signed)
   PRENATAL VISIT NOTE  Subjective:  Jo Graham is a 35 y.o. [id number] at [redacted]w[redacted]d being seen today for ongoing prenatal care.  She is currently monitored for the following issues for this high-risk pregnancy and has History of preterm delivery; Supervision of high risk pregnancy, antepartum; IUFD at less than 20 weeks of gestation; and Echogenic focus of bowel, fetal, affecting care of mother, antepartum on their problem list.  Patient reports no complaints.  Contractions: Not present. Vag. Bleeding: None.  Movement: Present. Denies leaking of fluid.   The following portions of the patient's history were reviewed and updated as appropriate: allergies, current medications, past family history, past medical history, past social history, past surgical history and problem list.   Objective:   Vitals:   09/20/20 1020  BP: 116/70  Pulse: (!) 105  Weight: 204 lb (92.5 kg)    Fetal Status: Fetal Heart Rate (bpm): 158 Fundal Height: 21 cm Movement: Present     General:  Alert, oriented and cooperative. Patient is in no acute distress.  Skin: Skin is warm and dry. No rash noted.   Cardiovascular: Normal heart rate noted  Respiratory: Normal respiratory effort, no problems with respiration noted  Abdomen: Soft, gravid, appropriate for gestational age.  Pain/Pressure: Present     Pelvic: Cervical exam deferred        Extremities: Normal range of motion.  Edema: None  Mental Status: Normal mood and affect. Normal behavior. Normal judgment and thought content.   Assessment and Plan:  Pregnancy: [id number] at [redacted]w[redacted]d 1. Supervision of high risk pregnancy, antepartum Continue prenatal care.   2. IUFD at less than 20 weeks of gestation F/u u/s in 2 wks  3. History of preterm delivery On Makena, no dose x 3 wks, working to get approval.  4. Echogenic focus of bowel of fetus affecting antepartum care of mother, single or unspecified fetus Has f/u u/s scheduled.  Preterm labor symptoms and  general obstetric precautions including but not limited to vaginal bleeding, contractions, leaking of fluid and fetal movement were reviewed in detail with the patient. Please refer to After Visit Summary for other counseling recommendations.   Return in 4 weeks (on 10/18/2020) for HRC, virtual, 17 P weekly self administered.  Future Appointments  Date Time Provider Department Center  10/03/2020  9:00 AM [facility] NURSE [facility]  10/03/2020  9:15 AM [facility] US2 [facility]    Reva Bores, MD

## 2020-09-21 ENCOUNTER — Telehealth: Payer: Self-pay | Admitting: General Practice

## 2020-09-21 NOTE — Telephone Encounter (Signed)
Called Jo Graham and discussed issue with patient receiving her autoinjectors & informed them the patient hasn't had a dose in 3 weeks. Jo Graham states they will reach out to [facility] our case manager who can look into the issue. They will also work on getting the patient an emergency dose. Called and discussed with patient. Advised her they may reach out and call her as well. Patient verbalized understanding.

## 2020-09-23 IMAGING — US US PELVIS COMPLETE
1 series · 15 of 25 positions shown · non-contrast
Comparison: None.

CLINICAL DATA: Passing blood clots.  Spontaneous abortion.

EXAM:
TRANSABDOMINAL ULTRASOUND OF PELVIS
TECHNIQUE: Transabdominal ultrasound examination of the pelvis was performed
including evaluation of the uterus, ovaries, adnexal regions, and
pelvic cul-de-sac.

[Series 1: us pelvis complete · 15 of 38 slices shown]
[im 1/38]
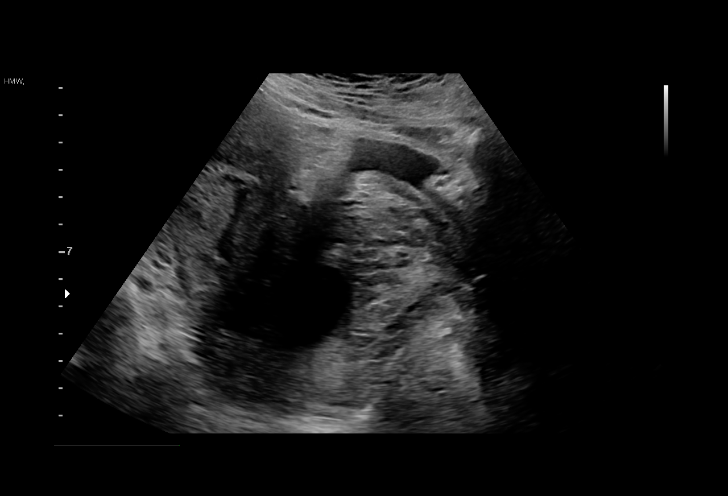
[im 4/38]
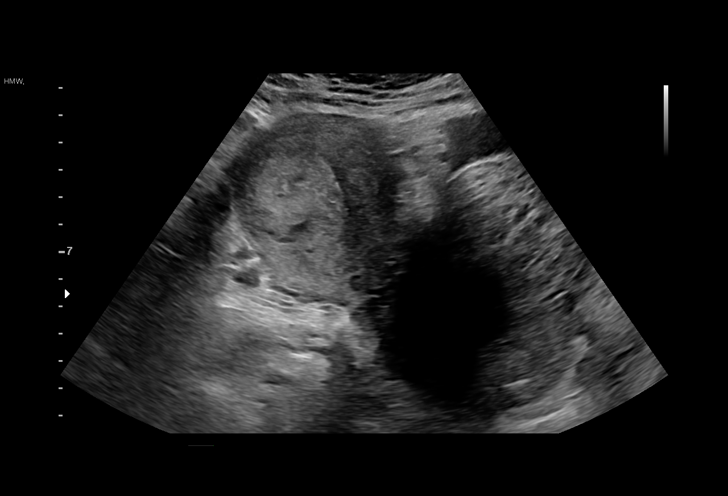
[im 7/38]
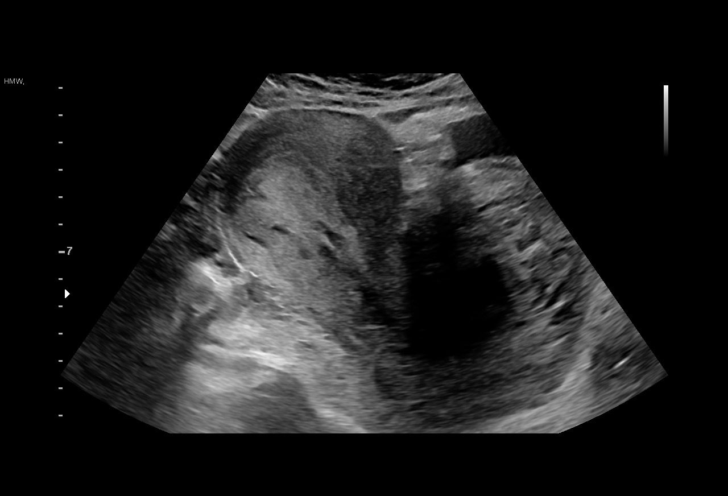
[im 8/38]
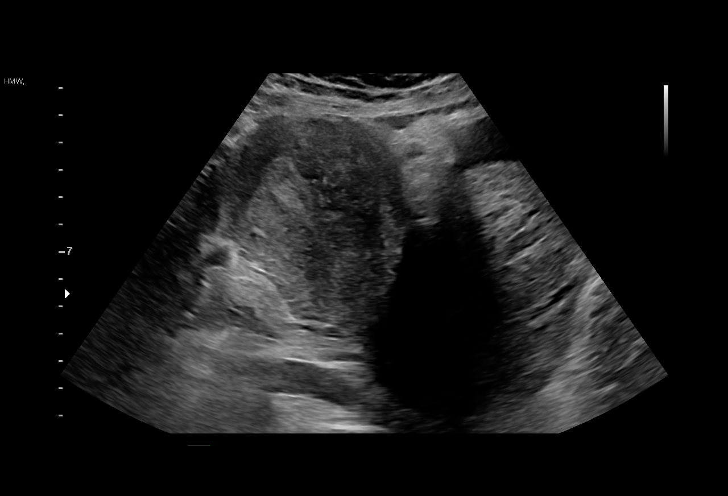
[im 11/38]
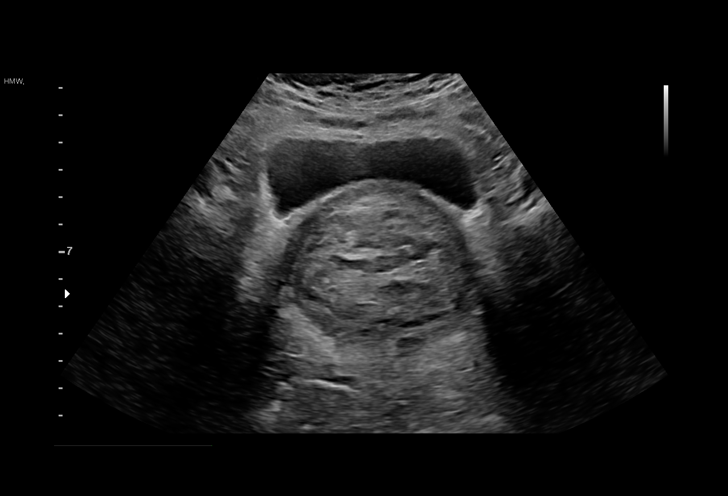
[im 14/38]
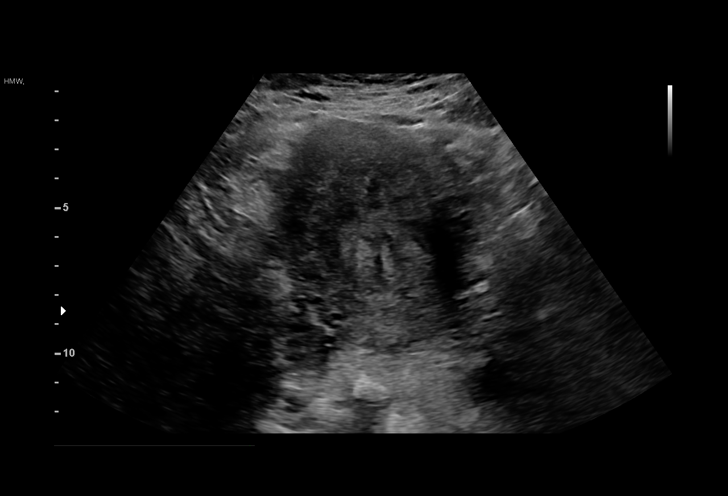
[im 16/38]
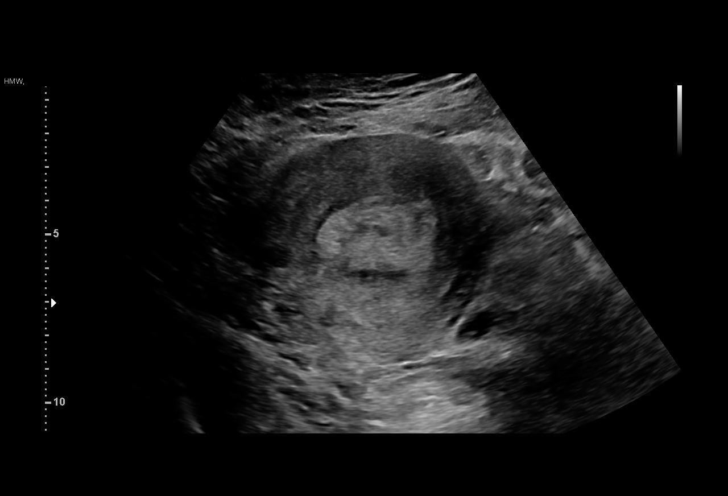
[im 19/38]
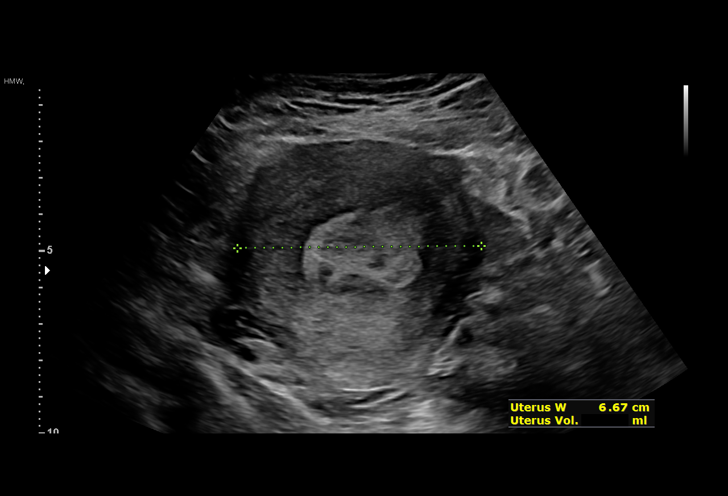
[im 22/38]
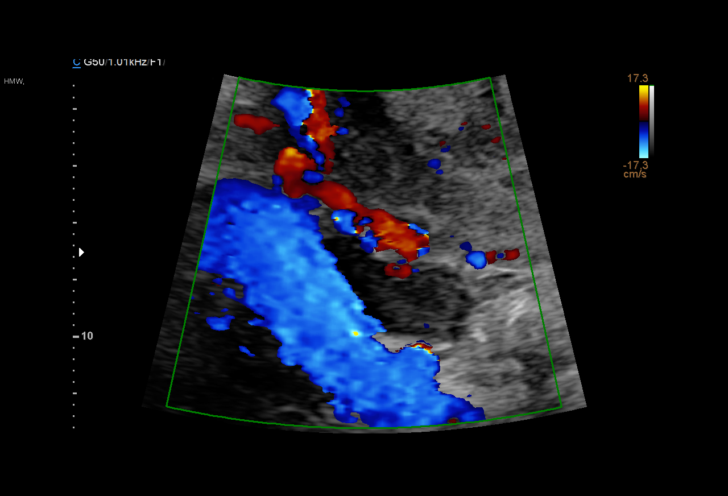
[im 24/38]
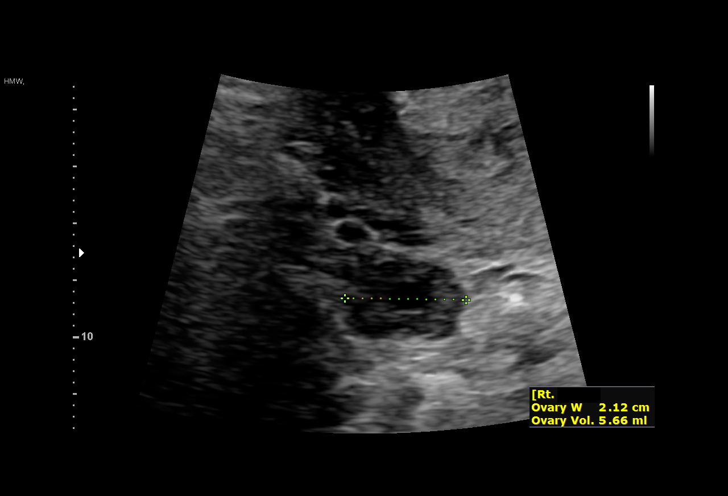
[im 27/38]
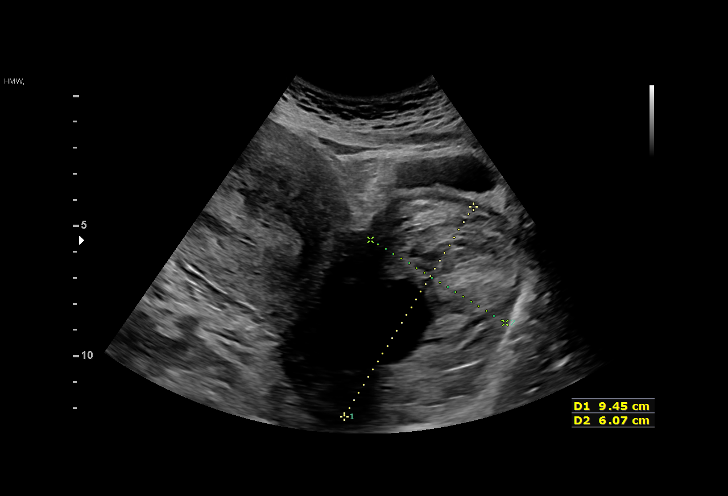
[im 30/38]
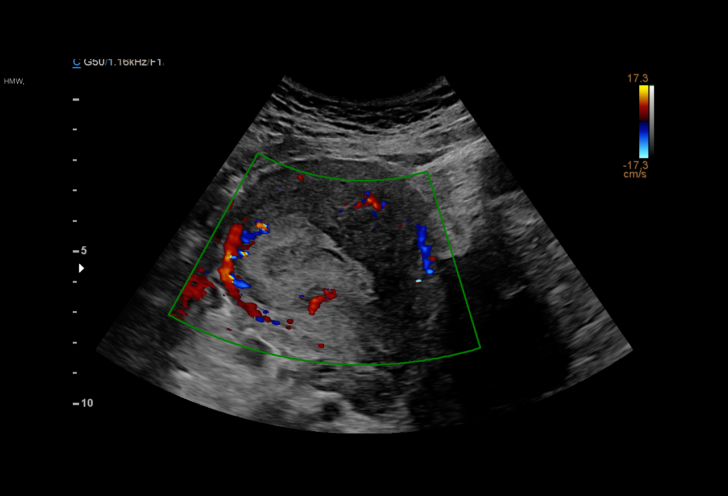
[im 31/38]
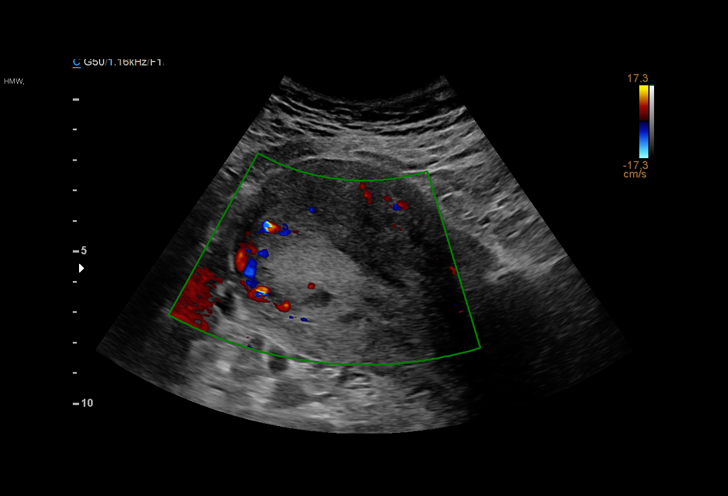
[im 34/38]
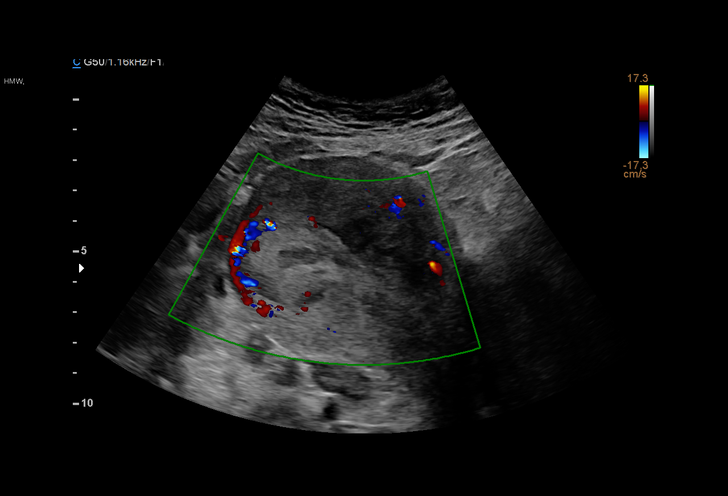
[im 38/38]
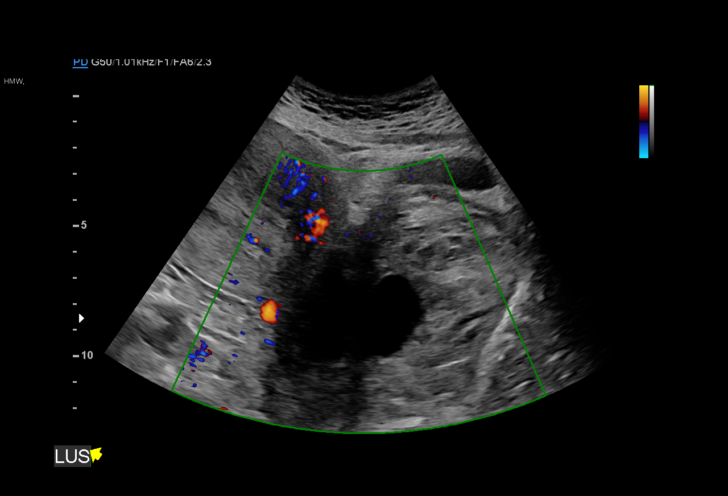

[15 of 25 positions shown; findings below may reference images not displayed]

FINDINGS: Uterus

Measurements: 13.5 x 6.8 x 6.7 cm = volume: 318 mL. No fibroids or
other mass visualized.

Endometrium

Thickness: 31 mm. There is a heterogeneous area in the lower uterine
segment without definite blood flow. This area measures 9.4 x
cm.

Right ovary

Measurements: 2.7 x 1.9 x 2.1 cm = volume: 5.7 mL. Normal
appearance/no adnexal mass.

Left ovary

Not visualized

Other findings:  No abnormal free fluid.
IMPRESSION: Thickened endometrium without definite vascularity.Findings are
nonspecific and could indicate endometrial blood products, although
hypovascular retained products of conception cannot be excluded.
Consider short term follow-up pelvic ultrasound or further
evaluation with pelvic MRI with and without IV contrast, as
clinically warranted.

## 2020-09-25 ENCOUNTER — Encounter: Payer: Self-pay | Admitting: General Practice

## 2020-10-03 ENCOUNTER — Ambulatory Visit: Payer: Managed Care, Other (non HMO) | Attending: Obstetrics and Gynecology

## 2020-10-03 ENCOUNTER — Ambulatory Visit: Payer: Managed Care, Other (non HMO) | Admitting: *Deleted

## 2020-10-03 ENCOUNTER — Other Ambulatory Visit: Payer: Self-pay | Admitting: *Deleted

## 2020-10-03 ENCOUNTER — Encounter: Payer: Self-pay | Admitting: *Deleted

## 2020-10-03 ENCOUNTER — Other Ambulatory Visit: Payer: Self-pay

## 2020-10-03 VITALS — BP 121/59 | HR 91

## 2020-10-03 DIAGNOSIS — O3482 Maternal care for other abnormalities of pelvic organs, second trimester: Secondary | ICD-10-CM | POA: Diagnosis not present

## 2020-10-03 DIAGNOSIS — R638 Other symptoms and signs concerning food and fluid intake: Secondary | ICD-10-CM | POA: Insufficient documentation

## 2020-10-03 DIAGNOSIS — Z6841 Body Mass Index (BMI) 40.0 and over, adult: Secondary | ICD-10-CM | POA: Insufficient documentation

## 2020-10-03 DIAGNOSIS — E669 Obesity, unspecified: Secondary | ICD-10-CM

## 2020-10-03 DIAGNOSIS — O09212 Supervision of pregnancy with history of pre-term labor, second trimester: Secondary | ICD-10-CM

## 2020-10-03 DIAGNOSIS — Z3A23 23 weeks gestation of pregnancy: Secondary | ICD-10-CM

## 2020-10-03 DIAGNOSIS — N83209 Unspecified ovarian cyst, unspecified side: Secondary | ICD-10-CM

## 2020-10-03 DIAGNOSIS — O359XX0 Maternal care for (suspected) fetal abnormality and damage, unspecified, not applicable or unspecified: Secondary | ICD-10-CM

## 2020-10-03 DIAGNOSIS — O99212 Obesity complicating pregnancy, second trimester: Secondary | ICD-10-CM

## 2020-10-18 ENCOUNTER — Telehealth (INDEPENDENT_AMBULATORY_CARE_PROVIDER_SITE_OTHER): Payer: Managed Care, Other (non HMO) | Admitting: Obstetrics and Gynecology

## 2020-10-18 ENCOUNTER — Encounter: Payer: Self-pay | Admitting: Obstetrics and Gynecology

## 2020-10-18 DIAGNOSIS — O0992 Supervision of high risk pregnancy, unspecified, second trimester: Secondary | ICD-10-CM

## 2020-10-18 DIAGNOSIS — Z6841 Body Mass Index (BMI) 40.0 and over, adult: Secondary | ICD-10-CM | POA: Insufficient documentation

## 2020-10-18 DIAGNOSIS — Z3A25 25 weeks gestation of pregnancy: Secondary | ICD-10-CM

## 2020-10-18 DIAGNOSIS — O358XX0 Maternal care for other (suspected) fetal abnormality and damage, not applicable or unspecified: Secondary | ICD-10-CM

## 2020-10-18 DIAGNOSIS — R8271 Bacteriuria: Secondary | ICD-10-CM | POA: Insufficient documentation

## 2020-10-18 DIAGNOSIS — O99820 Streptococcus B carrier state complicating pregnancy: Secondary | ICD-10-CM

## 2020-10-18 DIAGNOSIS — O99210 Obesity complicating pregnancy, unspecified trimester: Secondary | ICD-10-CM | POA: Insufficient documentation

## 2020-10-18 DIAGNOSIS — Z8751 Personal history of pre-term labor: Secondary | ICD-10-CM

## 2020-10-18 DIAGNOSIS — O0990 Supervision of high risk pregnancy, unspecified, unspecified trimester: Secondary | ICD-10-CM

## 2020-10-18 DIAGNOSIS — O2340 Unspecified infection of urinary tract in pregnancy, unspecified trimester: Secondary | ICD-10-CM | POA: Insufficient documentation

## 2020-10-18 DIAGNOSIS — Z8759 Personal history of other complications of pregnancy, childbirth and the puerperium: Secondary | ICD-10-CM

## 2020-10-18 DIAGNOSIS — O35DXX0 Maternal care for other (suspected) fetal abnormality and damage, fetal gastrointestinal anomalies, not applicable or unspecified: Secondary | ICD-10-CM

## 2020-10-18 DIAGNOSIS — O99212 Obesity complicating pregnancy, second trimester: Secondary | ICD-10-CM

## 2020-10-18 DIAGNOSIS — E669 Obesity, unspecified: Secondary | ICD-10-CM

## 2020-10-18 NOTE — Progress Notes (Signed)
   TELEHEALTH VIRTUAL OBSTETRICS VISIT ENCOUNTER NOTE  Clinic: Center for [facility]  I connected with [name] on 10/18/20 at  8:15 AM EST by telephone at home and verified that I am speaking with the correct person using two identifiers.   I discussed the limitations, risks, security and privacy concerns of performing an evaluation and management service by telephone and the availability of in person appointments. I also discussed with the patient that there may be a patient responsible charge related to this service. The patient expressed understanding and agreed to proceed.  Subjective:  Jo Graham is a 35 y.o. [phone] at [redacted]w[redacted]d being followed for ongoing prenatal care.  She is currently monitored for the following issues for this high-risk pregnancy and has History of preterm delivery; Supervision of high risk pregnancy, antepartum; IUFD at less than 20 weeks of gestation; Echogenic focus of bowel, fetal, affecting care of mother, antepartum; GBS bacteriuria; UTI in pregnancy, antepartum; BMI 40.0-44.9, adult (HCC); and Obesity in pregnancy on their problem list.  Patient reports no complaints. Reports fetal movement. Denies any contractions, bleeding or leaking of fluid.   The following portions of the patient's history were reviewed and updated as appropriate: allergies, current medications, past family history, past medical history, past social history, past surgical history and problem list.   Objective:  There were no vitals filed for this visit.  Babyscripts Data Reviewed: not applicable  General:  Alert, oriented and cooperative.   Mental Status: Normal mood and affect perceived. Normal judgment and thought content.  Rest of physical exam deferred due to type of encounter  Assessment and Plan:  Pregnancy: [id number] at [redacted]w[redacted]d 1. [redacted] weeks gestation of pregnancy  2. Supervision of high risk pregnancy, antepartum Routine care. 28wk labs nv. I d/w her that if  she has bad days where she feels bad that we'll need to see her for a visit to do fmla  Needs in person visits b/c mychart video not working.  3. Obesity in pregnancy  4. BMI 40.0-44.9, adult (HCC)  5. History of preterm delivery On at home 17p  6. Echogenic focus of bowel of fetus affecting antepartum care of mother, single or unspecified fetus Stable at 2/16. Follow up repeat ultrasounds  Preterm labor symptoms and general obstetric precautions including but not limited to vaginal bleeding, contractions, leaking of fluid and fetal movement were reviewed in detail with the patient.  I discussed the assessment and treatment plan with the patient. The patient was provided an opportunity to ask questions and all were answered. The patient agreed with the plan and demonstrated an understanding of the instructions. The patient was advised to call back or seek an in-person office evaluation/go to [facility] at [facility] for any urgent or concerning symptoms. Please refer to After Visit Summary for other counseling recommendations.   I provided 10 minutes of non-face-to-face time during this encounter. The visit was conducted via Telephone-medicine  Return in about 2 weeks (around 11/01/2020) for in person, 2hr GTT, md or app.  Future Appointments  Date Time Provider Department Center  11/07/2020  9:45 AM [facility] NURSE [facility]  11/07/2020 10:00 AM [facility] US1 [facility]    [place] Bing, MD Center for [employer], [facility]

## 2020-10-26 ENCOUNTER — Telehealth: Payer: Self-pay | Admitting: *Deleted

## 2020-10-26 NOTE — Telephone Encounter (Signed)
Patient had asked for a call in regards to her FMLA paperwork.  I spoke with patient via phone.  Patient states she would like intermittent FMLA and she has called out several times when she hasn't felt good - pain from "the baby sitting on a nerve and I have headaches".  States she needs intermittent leave in order to protect her job.  I explained to the patient we have written her out for intermittent leave to allow her to attend her doctor appointments.  I explained we do not give intermittent leave for patients to use when they feel bad.  I explained if she needs to be out for "not feeling well" she would need to be seen in the office on that date and we will give her a note for that day.  I explained she could discuss with a provider and if they agree, we will then complete her paperwork.  Reminded patient her next appointment is on 11/01/20 at 8:35 am with Dr. Donavan Foil.  Patient states understanding.

## 2020-10-30 ENCOUNTER — Other Ambulatory Visit: Payer: Self-pay | Admitting: Lactation Services

## 2020-10-30 DIAGNOSIS — O0990 Supervision of high risk pregnancy, unspecified, unspecified trimester: Secondary | ICD-10-CM

## 2020-11-01 ENCOUNTER — Ambulatory Visit (INDEPENDENT_AMBULATORY_CARE_PROVIDER_SITE_OTHER): Payer: Managed Care, Other (non HMO) | Admitting: Obstetrics and Gynecology

## 2020-11-01 ENCOUNTER — Other Ambulatory Visit: Payer: Managed Care, Other (non HMO)

## 2020-11-01 ENCOUNTER — Other Ambulatory Visit: Payer: Self-pay

## 2020-11-01 VITALS — BP 117/65 | HR 102 | Wt 204.7 lb

## 2020-11-01 DIAGNOSIS — Z23 Encounter for immunization: Secondary | ICD-10-CM

## 2020-11-01 DIAGNOSIS — O35DXX0 Maternal care for other (suspected) fetal abnormality and damage, fetal gastrointestinal anomalies, not applicable or unspecified: Secondary | ICD-10-CM

## 2020-11-01 DIAGNOSIS — Z8751 Personal history of pre-term labor: Secondary | ICD-10-CM | POA: Diagnosis not present

## 2020-11-01 DIAGNOSIS — O358XX0 Maternal care for other (suspected) fetal abnormality and damage, not applicable or unspecified: Secondary | ICD-10-CM

## 2020-11-01 DIAGNOSIS — O0990 Supervision of high risk pregnancy, unspecified, unspecified trimester: Secondary | ICD-10-CM | POA: Diagnosis not present

## 2020-11-01 DIAGNOSIS — O99210 Obesity complicating pregnancy, unspecified trimester: Secondary | ICD-10-CM

## 2020-11-01 DIAGNOSIS — Z3A27 27 weeks gestation of pregnancy: Secondary | ICD-10-CM | POA: Insufficient documentation

## 2020-11-01 DIAGNOSIS — R8271 Bacteriuria: Secondary | ICD-10-CM

## 2020-11-01 DIAGNOSIS — Z6841 Body Mass Index (BMI) 40.0 and over, adult: Secondary | ICD-10-CM

## 2020-11-01 NOTE — Progress Notes (Addendum)
PRENATAL VISIT NOTE  Subjective:  Jo Graham is a 35 y.o. [id number] at [redacted]w[redacted]d being seen today for ongoing prenatal care.  She is currently monitored for the following issues for this high-risk pregnancy and has History of preterm delivery; Supervision of high risk pregnancy, antepartum; IUFD at less than 20 weeks of gestation; Echogenic focus of bowel, fetal, affecting care of mother, antepartum; GBS bacteriuria; UTI in pregnancy, antepartum; BMI 40.0-44.9, adult (HCC); Obesity in pregnancy; and [redacted] weeks gestation of pregnancy on their problem list.  Patient doing well with no acute concerns today. She reports no complaints.  Contractions: Not present. Vag. Bleeding: None.  Movement: Present. Denies leaking of fluid.    Patient desires bilateral tubal sterilization.  Other reversible forms of contraception were discussed with patient; she declines all other modalities.  Discussed bilateral tubal sterilization in detail; discussed laparoscopic bilateral tubal sterilization using Filshie clips as well as postpartum tubal ligation. Risks and benefits discussed in detail including but not limited to: risk of regret, permanence of method, bleeding, infection, injury to surrounding organs and need for additional procedures.  Failure risk of 1% for Filshie clips with increased risk of ectopic gestation if pregnancy occurs was also discussed with patient.   Patient verbalized understanding of these risks and benefits and wants to proceed with sterilization tubal ligation She was told that she will be contacted by our surgical scheduler regarding the time and date of her surgery; routine preoperative instructions of having nothing to eat or drink after midnight on the day prior to surgery and also coming to the hospital 1 1/2 hours prior to her time of surgery were also emphasized.  She was told she may be called for a preoperative appointment about a week prior to surgery and will be given further  preoperative instructions at that visit.  Routine postoperative instructions will be reviewed with the patient and her family in detail after surgery. Printed patient education handouts about the procedure was given to the patient to review at home.  Medicaid papers signed today, patient understands that there will be a waiting period at least 30 days after the  papers are signed as per Medicaid guidelines.   Objective:   Vitals:   11/01/20 0855  BP: 117/65  Pulse: (!) 102  Weight: 204 lb 11.2 oz (92.9 kg)    Fetal Status: Fetal Heart Rate (bpm): 152   Movement: Present     General:  Alert, oriented and cooperative. Patient is in no acute distress.  Skin: Skin is warm and dry. No rash noted.   Cardiovascular: Normal heart rate noted  Respiratory: Normal respiratory effort, no problems with respiration noted  Abdomen: Soft, gravid, appropriate for gestational age.  Pain/Pressure: Present     Pelvic: Cervical exam deferred        Extremities: Normal range of motion.  Edema: None  Mental Status:  Normal mood and affect. Normal behavior. Normal judgment and thought content.   Assessment and Plan:  Pregnancy: [id number] at [redacted]w[redacted]d  1. History of preterm delivery Pt takes makena  2. Supervision of high risk pregnancy, antepartum PT notes feeling unwell after makena injections, will ok intermittent leave 4 days/month - Tdap vaccine greater than or equal to 7yo IM  3. BMI 40.0-44.9, adult (HCC)   4. Echogenic focus of bowel of fetus affecting antepartum care of mother, single or unspecified fetus Not well seen on last u/s  5. GBS bacteriuria Treat in labor  6. Obesity in pregnancy  7. [redacted] weeks gestation of pregnancy   Preterm labor symptoms and general obstetric precautions including but not limited to vaginal bleeding, contractions, leaking of fluid and fetal movement were reviewed in detail with the patient.  Please refer to After Visit Summary for other counseling  recommendations.   Return in about 2 weeks (around 11/15/2020) for [facility], in person.   Mariel Aloe, MD Faculty Attending Center for [facility]

## 2020-11-01 NOTE — Patient Instructions (Addendum)
Oral Glucose Tolerance Test During Pregnancy Why am I having this test? The oral glucose tolerance test (OGTT) is done to check how your body processes blood sugar (glucose). This is one of several tests used to diagnose diabetes that develops during pregnancy (gestational diabetes mellitus). Gestational diabetes is a short-term form of diabetes that some women develop while they are pregnant. It usually occurs during the second trimester of pregnancy and goes away after delivery. Testing, or screening, for gestational diabetes usually occurs at weeks 24-28 of pregnancy. You may have the OGTT test after having a 1-hour glucose screening test if the results from that test indicate that you may have gestational diabetes. This test may also be needed if:  You have a history of gestational diabetes.  There is a history of giving birth to very large babies or of losing pregnancies (having stillbirths).  You have signs and symptoms of diabetes, such as: ? Changes in your eyesight. ? Tingling or numbness in your hands or feet. ? Changes in hunger, thirst, and urination, and these are not explained by your pregnancy. What is being tested? This test measures the amount of glucose in your blood at different times during a period of 3 hours. This shows how well your body can process glucose. What kind of sample is taken? Blood samples are required for this test. They are usually collected by inserting a needle into a blood vessel.   How do I prepare for this test?  For 3 days before your test, eat normally. Have plenty of carbohydrate-rich foods.  Follow instructions from your health care provider about: ? Eating or drinking restrictions on the day of the test. You may be asked not to eat or drink anything other than water (to fast) starting 8-10 hours before the test. ? Changing or stopping your regular medicines. Some medicines may interfere with this test. Tell a health care provider about:  All  medicines you are taking, including vitamins, herbs, eye drops, creams, and over-the-counter medicines.  Any blood disorders you have.  Any surgeries you have had.  Any medical conditions you have. What happens during the test? First, your blood glucose will be measured. This is referred to as your fasting blood glucose because you fasted before the test. Then, you will drink a glucose solution that contains a certain amount of glucose. Your blood glucose will be measured again 1, 2, and 3 hours after you drink the solution. This test takes about 3 hours to complete. You will need to stay at the testing location during this time. During the testing period:  Do not eat or drink anything other than the glucose solution.  Do not exercise.  Do not use any products that contain nicotine or tobacco, such as cigarettes, e-cigarettes, and chewing tobacco. These can affect your test results. If you need help quitting, ask your health care provider. The testing procedure may vary among health care providers and hospitals. How are the results reported? Your results will be reported as milligrams of glucose per deciliter of blood (mg/dL) or millimoles per liter (mmol/L). There is more than one source for screening and diagnosis reference values used to diagnose gestational diabetes. Your health care provider will compare your results to normal values that were established after testing a large group of people (reference values). Reference values may vary among labs and hospitals. For this test (Carpenter-Coustan), reference values are:  Fasting: 95 mg/dL (5.3 mmol/L).  1 hour: 180 mg/dL (10.0 mmol/L).  2 hour:   155 mg/dL (8.6 mmol/L).  3 hour: 140 mg/dL (7.8 mmol/L). What do the results mean? Results below the reference values are considered normal. If two or more of your blood glucose levels are at or above the reference values, you may be diagnosed with gestational diabetes. If only one level is  high, your health care provider may suggest repeat testing or other tests to confirm a diagnosis. Talk with your health care provider about what your results mean. Questions to ask your health care provider Ask your health care provider, or the department that is doing the test:  When will my results be ready?  How will I get my results?  What are my treatment options?  What other tests do I need?  What are my next steps? Summary  The oral glucose tolerance test (OGTT) is one of several tests used to diagnose diabetes that develops during pregnancy (gestational diabetes mellitus). Gestational diabetes is a short-term form of diabetes that some women develop while they are pregnant.  You may have the OGTT test after having a 1-hour glucose screening test if the results from that test show that you may have gestational diabetes. You may also have this test if you have any symptoms or risk factors for this type of diabetes.  Talk with your health care provider about what your results mean. This information is not intended to replace advice given to you by your health care provider. Make sure you discuss any questions you have with your health care provider. Document Revised: 01/12/2020 Document Reviewed: 01/12/2020 Elsevier Patient Education  2021 [place].  Round Ligament Pain  The round ligament is a cord of muscle and tissue that helps support the uterus. It can become a source of pain during pregnancy if it becomes stretched or twisted as the baby grows. The pain usually begins in the second trimester (13-28 weeks) of pregnancy, and it can come and go until the baby is delivered. It is not a serious problem, and it does not cause harm to the baby. Round ligament pain is usually a short, sharp, and pinching pain, but it can also be a dull, lingering, and aching pain. The pain is felt in the lower side of the abdomen or in the groin. It usually starts deep in the groin and moves up to  the outside of the hip area. The pain may occur when you:  Suddenly change position, such as quickly going from a sitting to standing position.  Roll over in bed.  Cough or sneeze.  Do physical activity. Follow these instructions at home:  Watch your condition for any changes.  When the pain starts, relax. Then try any of these methods to help with the pain: ? Sitting down. ? Flexing your knees up to your abdomen. ? Lying on your side with one pillow under your abdomen and another pillow between your legs. ? Sitting in a warm bath for 15-20 minutes or until the pain goes away.  Take over-the-counter and prescription medicines only as told by your health care provider.  Move slowly when you sit down or stand up.  Avoid long walks if they cause pain.  Stop or reduce your physical activities if they cause pain.  Keep all follow-up visits as told by your health care provider. This is important.   Contact a health care provider if:  Your pain does not go away with treatment.  You feel pain in your back that you did not have before.  Your  medicine is not helping. Get help right away if:  You have a fever or chills.  You develop uterine contractions.  You have vaginal bleeding.  You have nausea or vomiting.  You have diarrhea.  You have pain when you urinate. Summary  Round ligament pain is felt in the lower abdomen or groin. It is usually a short, sharp, and pinching pain. It can also be a dull, lingering, and aching pain.  This pain usually begins in the second trimester (13-28 weeks). It occurs because the uterus is stretching with the growing baby, and it is not harmful to the baby.  You may notice the pain when you suddenly change position, when you cough or sneeze, or during physical activity.  Relaxing, flexing your knees to your abdomen, lying on one side, or taking a warm bath may help to get rid of the pain.  Get help from your health care provider if the  pain does not go away or if you have vaginal bleeding, nausea, vomiting, diarrhea, or painful urination. This information is not intended to replace advice given to you by your health care provider. Make sure you discuss any questions you have with your health care provider. Document Revised: 01/20/2018 Document Reviewed: 01/20/2018 Elsevier Patient Education  2021 [employer].

## 2020-11-02 LAB — CBC
Hematocrit: 30.5 % — ABNORMAL LOW (ref 34.0–46.6)
Hemoglobin: 10 g/dL — ABNORMAL LOW (ref 11.1–15.9)
MCH: 27.3 pg (ref 26.6–33.0)
MCHC: 32.8 g/dL (ref 31.5–35.7)
MCV: 83 fL (ref 79–97)
Platelets: 264 10*3/uL (ref 150–450)
RBC: 3.66 x10E6/uL — ABNORMAL LOW (ref 3.77–5.28)
RDW: 14.1 % (ref 11.7–15.4)
WBC: 8.5 10*3/uL (ref 3.4–10.8)

## 2020-11-02 LAB — HIV ANTIBODY (ROUTINE TESTING W REFLEX): HIV Screen 4th Generation wRfx: NONREACTIVE

## 2020-11-02 LAB — GLUCOSE TOLERANCE, 2 HOURS W/ 1HR
Glucose, 1 hour: 156 mg/dL (ref 65–179)
Glucose, 2 hour: 122 mg/dL (ref 65–152)
Glucose, Fasting: 85 mg/dL (ref 65–91)

## 2020-11-02 LAB — RPR: RPR Ser Ql: NONREACTIVE

## 2020-11-05 ENCOUNTER — Encounter: Payer: Self-pay | Admitting: *Deleted

## 2020-11-07 ENCOUNTER — Other Ambulatory Visit: Payer: Self-pay

## 2020-11-07 ENCOUNTER — Ambulatory Visit: Payer: Managed Care, Other (non HMO) | Attending: Maternal & Fetal Medicine

## 2020-11-07 ENCOUNTER — Other Ambulatory Visit: Payer: Self-pay | Admitting: Obstetrics

## 2020-11-07 ENCOUNTER — Encounter: Payer: Self-pay | Admitting: *Deleted

## 2020-11-07 ENCOUNTER — Ambulatory Visit: Payer: Managed Care, Other (non HMO) | Admitting: *Deleted

## 2020-11-07 DIAGNOSIS — O359XX0 Maternal care for (suspected) fetal abnormality and damage, unspecified, not applicable or unspecified: Secondary | ICD-10-CM | POA: Diagnosis not present

## 2020-11-07 DIAGNOSIS — O99210 Obesity complicating pregnancy, unspecified trimester: Secondary | ICD-10-CM

## 2020-11-07 DIAGNOSIS — O09299 Supervision of pregnancy with other poor reproductive or obstetric history, unspecified trimester: Secondary | ICD-10-CM

## 2020-11-07 DIAGNOSIS — R638 Other symptoms and signs concerning food and fluid intake: Secondary | ICD-10-CM | POA: Diagnosis not present

## 2020-11-07 DIAGNOSIS — E669 Obesity, unspecified: Secondary | ICD-10-CM

## 2020-11-07 DIAGNOSIS — O09213 Supervision of pregnancy with history of pre-term labor, third trimester: Secondary | ICD-10-CM | POA: Diagnosis not present

## 2020-11-07 DIAGNOSIS — O99213 Obesity complicating pregnancy, third trimester: Secondary | ICD-10-CM | POA: Diagnosis not present

## 2020-11-07 DIAGNOSIS — Z3A28 28 weeks gestation of pregnancy: Secondary | ICD-10-CM

## 2020-11-15 ENCOUNTER — Ambulatory Visit (INDEPENDENT_AMBULATORY_CARE_PROVIDER_SITE_OTHER): Payer: Managed Care, Other (non HMO) | Admitting: Family Medicine

## 2020-11-15 ENCOUNTER — Other Ambulatory Visit: Payer: Self-pay

## 2020-11-15 VITALS — BP 114/70 | HR 104 | Wt 206.1 lb

## 2020-11-15 DIAGNOSIS — O99013 Anemia complicating pregnancy, third trimester: Secondary | ICD-10-CM | POA: Insufficient documentation

## 2020-11-15 DIAGNOSIS — O0990 Supervision of high risk pregnancy, unspecified, unspecified trimester: Secondary | ICD-10-CM

## 2020-11-15 DIAGNOSIS — O99210 Obesity complicating pregnancy, unspecified trimester: Secondary | ICD-10-CM

## 2020-11-15 DIAGNOSIS — O021 Missed abortion: Secondary | ICD-10-CM

## 2020-11-15 NOTE — Progress Notes (Signed)
.     PRENATAL VISIT NOTE  Subjective:  Jo Graham is a 35 y.o. [phone] at [redacted]w[redacted]d being seen today for ongoing prenatal care.  She is currently monitored for the following issues for this high-risk pregnancy and has History of preterm delivery; Supervision of high risk pregnancy, antepartum; IUFD at less than 20 weeks of gestation; Echogenic focus of bowel, fetal, affecting care of mother, antepartum; GBS bacteriuria; UTI in pregnancy, antepartum; BMI 40.0-44.9, adult (HCC); Obesity in pregnancy; and Anemia in pregnancy, third trimester on their problem list.  Patient reports no complaints.  Contractions: Not present. Vag. Bleeding: None.  Movement: Present. Denies leaking of fluid.   The following portions of the patient's history were reviewed and updated as appropriate: allergies, current medications, past family history, past medical history, past social history, past surgical history and problem list.   Objective:   Vitals:   11/15/20 1630  BP: 114/70  Pulse: (!) 104  Weight: 206 lb 1.6 oz (93.5 kg)    Fetal Status: Fetal Heart Rate (bpm): 150   Movement: Present     General:  Alert, oriented and cooperative. Patient is in no acute distress.  Skin: Skin is warm and dry. No rash noted.   Cardiovascular: Normal heart rate noted  Respiratory: Normal respiratory effort, no problems with respiration noted  Abdomen: Soft, gravid, appropriate for gestational age.  Pain/Pressure: Absent     Pelvic: Cervical exam deferred        Extremities: Normal range of motion.  Edema: None  Mental Status: Normal mood and affect. Normal behavior. Normal judgment and thought content.   Assessment and Plan:  Pregnancy: [id number] at [redacted]w[redacted]d 1. Supervision of high risk pregnancy, antepartum OB box up to date Does not desire WB  2. Obesity in pregnancy TWG=-13 lb 14.4 oz (-6.305 kg)   3. IUFD at less than 20 weeks of gestation MFM /fu  4. Anemia in pregnancy, third trimester Will start fe  supplement   Preterm labor symptoms and general obstetric precautions including but not limited to vaginal bleeding, contractions, leaking of fluid and fetal movement were reviewed in detail with the patient. Please refer to After Visit Summary for other counseling recommendations.   Return in about 2 weeks (around 11/29/2020) for Routine prenatal care, in person, MD or APP.  Future Appointments  Date Time Provider Department Center  12/06/2020 11:15 AM [facility] NURSE [facility]  12/06/2020 11:30 AM [facility] US3 [facility]    Federico Flake, MD

## 2020-11-15 NOTE — Progress Notes (Signed)
Spoke to patient about concerns for anemia and needing to restart iron pill. Hgb on 3/17 was 10 so recommended to restart iron polysaccharide complex capsules. Will send new prescription so she has plenty of refills. She also asked about stool softener as well. Discussed starting iron pill taking every other day versus daily and can call for docusate prescription if experiencing constipation. Did counsel to not take Sennakot (senna-docusate) at home as it is not recommended in pregnancy.

## 2020-11-15 NOTE — Patient Instructions (Signed)

## 2020-11-27 ENCOUNTER — Telehealth: Payer: Self-pay | Admitting: Family Medicine

## 2020-11-27 NOTE — Telephone Encounter (Signed)
This pt states that the FMLA paperwork was filled out but dates had to be changed and pt states you wrote new dates on the side but company will not accept that and that the forms had to be redone or your initials beside the corrected dates, pt would like for you to call her back ASAP, thanks

## 2020-11-29 ENCOUNTER — Encounter: Payer: Self-pay | Admitting: Family Medicine

## 2020-11-29 ENCOUNTER — Other Ambulatory Visit: Payer: Self-pay

## 2020-11-29 ENCOUNTER — Ambulatory Visit (INDEPENDENT_AMBULATORY_CARE_PROVIDER_SITE_OTHER): Payer: Managed Care, Other (non HMO) | Admitting: Family Medicine

## 2020-11-29 VITALS — BP 105/60 | HR 94 | Wt 208.1 lb

## 2020-11-29 DIAGNOSIS — Z8751 Personal history of pre-term labor: Secondary | ICD-10-CM

## 2020-11-29 DIAGNOSIS — O0990 Supervision of high risk pregnancy, unspecified, unspecified trimester: Secondary | ICD-10-CM

## 2020-11-29 NOTE — Progress Notes (Signed)
   PRENATAL VISIT NOTE  Subjective:  Jo Graham is a 35 y.o. [id number] at [redacted]w[redacted]d being seen today for ongoing prenatal care.  She is currently monitored for the following issues for this high-risk pregnancy and has History of preterm delivery; Supervision of high risk pregnancy, antepartum; Echogenic focus of bowel, fetal, affecting care of mother, antepartum; GBS bacteriuria; UTI in pregnancy, antepartum; BMI 40.0-44.9, adult (HCC); Obesity in pregnancy; and Anemia in pregnancy, third trimester on their problem list.  Patient reports no complaints.  Contractions: Irritability. Vag. Bleeding: None.  Movement: Present. Denies leaking of fluid.   The following portions of the patient's history were reviewed and updated as appropriate: allergies, current medications, past family history, past medical history, past social history, past surgical history and problem list.   Objective:   Vitals:   11/29/20 1551  BP: 105/60  Pulse: 94  Weight: 208 lb 1.6 oz (94.4 kg)    Fetal Status: Fetal Heart Rate (bpm): 157 Fundal Height: 29 cm Movement: Present     General:  Alert, oriented and cooperative. Patient is in no acute distress.  Skin: Skin is warm and dry. No rash noted.   Cardiovascular: Normal heart rate noted  Respiratory: Normal respiratory effort, no problems with respiration noted  Abdomen: Soft, gravid, appropriate for gestational age.  Pain/Pressure: Absent     Pelvic: Cervical exam deferred        Extremities: Normal range of motion.  Edema: None  Mental Status: Normal mood and affect. Normal behavior. Normal judgment and thought content.   Assessment and Plan:  Pregnancy: [id number] at [redacted]w[redacted]d 1. Supervision of high risk pregnancy, antepartum Continue routine prenatal care.   2. History of preterm delivery On [place]  Preterm labor symptoms and general obstetric precautions including but not limited to vaginal bleeding, contractions, leaking of fluid and fetal movement were  reviewed in detail with the patient. Please refer to After Visit Summary for other counseling recommendations.   Return in 2 weeks (on 12/13/2020).  Future Appointments  Date Time Provider Department Center  12/06/2020 11:15 AM [facility] NURSE [facility]  12/06/2020 11:30 AM [facility]3 [facility]  12/13/2020  2:20 PM Adam Phenix, MD [facility]    Reva Bores, MD

## 2020-11-29 NOTE — Patient Instructions (Signed)

## 2020-12-06 ENCOUNTER — Other Ambulatory Visit: Payer: Self-pay

## 2020-12-06 ENCOUNTER — Encounter: Payer: Self-pay | Admitting: *Deleted

## 2020-12-06 ENCOUNTER — Other Ambulatory Visit: Payer: Self-pay | Admitting: *Deleted

## 2020-12-06 ENCOUNTER — Ambulatory Visit: Payer: Managed Care, Other (non HMO) | Attending: Obstetrics

## 2020-12-06 ENCOUNTER — Ambulatory Visit: Payer: Managed Care, Other (non HMO) | Admitting: *Deleted

## 2020-12-06 DIAGNOSIS — O09299 Supervision of pregnancy with other poor reproductive or obstetric history, unspecified trimester: Secondary | ICD-10-CM | POA: Insufficient documentation

## 2020-12-06 DIAGNOSIS — O99213 Obesity complicating pregnancy, third trimester: Secondary | ICD-10-CM

## 2020-12-06 DIAGNOSIS — E669 Obesity, unspecified: Secondary | ICD-10-CM | POA: Diagnosis not present

## 2020-12-06 DIAGNOSIS — Z362 Encounter for other antenatal screening follow-up: Secondary | ICD-10-CM

## 2020-12-06 DIAGNOSIS — Z6841 Body Mass Index (BMI) 40.0 and over, adult: Secondary | ICD-10-CM

## 2020-12-06 DIAGNOSIS — O99210 Obesity complicating pregnancy, unspecified trimester: Secondary | ICD-10-CM | POA: Insufficient documentation

## 2020-12-06 DIAGNOSIS — O99013 Anemia complicating pregnancy, third trimester: Secondary | ICD-10-CM | POA: Diagnosis present

## 2020-12-06 DIAGNOSIS — O359XX0 Maternal care for (suspected) fetal abnormality and damage, unspecified, not applicable or unspecified: Secondary | ICD-10-CM

## 2020-12-06 DIAGNOSIS — O09293 Supervision of pregnancy with other poor reproductive or obstetric history, third trimester: Secondary | ICD-10-CM

## 2020-12-06 DIAGNOSIS — Z3A32 32 weeks gestation of pregnancy: Secondary | ICD-10-CM

## 2020-12-13 ENCOUNTER — Encounter: Payer: Managed Care, Other (non HMO) | Admitting: Obstetrics & Gynecology

## 2020-12-19 ENCOUNTER — Ambulatory Visit (INDEPENDENT_AMBULATORY_CARE_PROVIDER_SITE_OTHER): Payer: Managed Care, Other (non HMO) | Admitting: Family Medicine

## 2020-12-19 ENCOUNTER — Other Ambulatory Visit: Payer: Self-pay

## 2020-12-19 VITALS — BP 106/64 | HR 97 | Wt 208.0 lb

## 2020-12-19 DIAGNOSIS — R8271 Bacteriuria: Secondary | ICD-10-CM

## 2020-12-19 DIAGNOSIS — Z3009 Encounter for other general counseling and advice on contraception: Secondary | ICD-10-CM

## 2020-12-19 DIAGNOSIS — O35DXX0 Maternal care for other (suspected) fetal abnormality and damage, fetal gastrointestinal anomalies, not applicable or unspecified: Secondary | ICD-10-CM

## 2020-12-19 DIAGNOSIS — O358XX0 Maternal care for other (suspected) fetal abnormality and damage, not applicable or unspecified: Secondary | ICD-10-CM

## 2020-12-19 DIAGNOSIS — O99013 Anemia complicating pregnancy, third trimester: Secondary | ICD-10-CM

## 2020-12-19 DIAGNOSIS — O2340 Unspecified infection of urinary tract in pregnancy, unspecified trimester: Secondary | ICD-10-CM

## 2020-12-19 DIAGNOSIS — O0990 Supervision of high risk pregnancy, unspecified, unspecified trimester: Secondary | ICD-10-CM

## 2020-12-19 DIAGNOSIS — Z8751 Personal history of pre-term labor: Secondary | ICD-10-CM

## 2020-12-19 DIAGNOSIS — O99210 Obesity complicating pregnancy, unspecified trimester: Secondary | ICD-10-CM

## 2020-12-19 MED ORDER — FERROUS SULFATE 325 (65 FE) MG PO TBEC: 325.0000 mg | DELAYED_RELEASE_TABLET | ORAL | 2 refills | Status: DC

## 2020-12-19 NOTE — Progress Notes (Signed)
   Subjective:  Jo Graham is a 35 y.o. [id number] at [redacted]w[redacted]d being seen today for ongoing prenatal care.  She is currently monitored for the following issues for this high-risk pregnancy and has History of preterm delivery; Supervision of high risk pregnancy, antepartum; Echogenic focus of bowel, fetal, affecting care of mother, antepartum; GBS bacteriuria; UTI in pregnancy, antepartum; BMI 40.0-44.9, adult (HCC); Obesity in pregnancy; and Anemia in pregnancy, third trimester on their problem list.  Patient reports no complaints.  Contractions: Irritability. Vag. Bleeding: None.  Movement: Present. Denies leaking of fluid.   The following portions of the patient's history were reviewed and updated as appropriate: allergies, current medications, past family history, past medical history, past social history, past surgical history and problem list. Problem list updated.  Objective:   Vitals:   12/19/20 1412  BP: 106/64  Pulse: 97  Weight: 208 lb (94.3 kg)    Fetal Status: Fetal Heart Rate (bpm): 150   Movement: Present     General:  Alert, oriented and cooperative. Patient is in no acute distress.  Skin: Skin is warm and dry. No rash noted.   Cardiovascular: Normal heart rate noted  Respiratory: Normal respiratory effort, no problems with respiration noted  Abdomen: Soft, gravid, appropriate for gestational age. Pain/Pressure: Absent     Pelvic: Vag. Bleeding: None     Cervical exam deferred        Extremities: Normal range of motion.  Edema: None  Mental Status: Normal mood and affect. Normal behavior. Normal judgment and thought content.   Urinalysis:      Assessment and Plan:  Pregnancy: [id number] at [redacted]w[redacted]d  1. Supervision of high risk pregnancy, antepartum BP and FHR normal  2. History of preterm delivery First pregnancy at 36w  3. UTI in pregnancy, antepartum Neg TOC  4. GBS bacteriuria Tx in labor  5. Obesity in pregnancy   6. Anemia in pregnancy, third  trimester Last hgb 10, will send PO iron  7. Echogenic focus of bowel of fetus affecting antepartum care of mother, single or unspecified fetus Not seen on most recent US  8. Unwanted fertility BTL papers signed 11/01/2020  Preterm labor symptoms and general obstetric precautions including but not limited to vaginal bleeding, contractions, leaking of fluid and fetal movement were reviewed in detail with the patient. Please refer to After Visit Summary for other counseling recommendations.  No follow-ups on file.   Venora Maples, MD

## 2020-12-27 ENCOUNTER — Ambulatory Visit: Payer: Managed Care, Other (non HMO) | Attending: Obstetrics and Gynecology

## 2020-12-27 ENCOUNTER — Other Ambulatory Visit: Payer: Self-pay

## 2020-12-27 ENCOUNTER — Encounter: Payer: Self-pay | Admitting: *Deleted

## 2020-12-27 ENCOUNTER — Ambulatory Visit: Payer: Managed Care, Other (non HMO) | Admitting: *Deleted

## 2020-12-27 DIAGNOSIS — E669 Obesity, unspecified: Secondary | ICD-10-CM | POA: Diagnosis not present

## 2020-12-27 DIAGNOSIS — O09213 Supervision of pregnancy with history of pre-term labor, third trimester: Secondary | ICD-10-CM

## 2020-12-27 DIAGNOSIS — O09293 Supervision of pregnancy with other poor reproductive or obstetric history, third trimester: Secondary | ICD-10-CM | POA: Insufficient documentation

## 2020-12-27 DIAGNOSIS — O99213 Obesity complicating pregnancy, third trimester: Secondary | ICD-10-CM | POA: Insufficient documentation

## 2020-12-27 DIAGNOSIS — O359XX0 Maternal care for (suspected) fetal abnormality and damage, unspecified, not applicable or unspecified: Secondary | ICD-10-CM | POA: Diagnosis not present

## 2020-12-27 DIAGNOSIS — Z3A35 35 weeks gestation of pregnancy: Secondary | ICD-10-CM | POA: Insufficient documentation

## 2020-12-27 DIAGNOSIS — Z6841 Body Mass Index (BMI) 40.0 and over, adult: Secondary | ICD-10-CM

## 2020-12-27 DIAGNOSIS — O99013 Anemia complicating pregnancy, third trimester: Secondary | ICD-10-CM

## 2021-01-03 ENCOUNTER — Ambulatory Visit: Payer: Managed Care, Other (non HMO) | Admitting: *Deleted

## 2021-01-03 ENCOUNTER — Ambulatory Visit (INDEPENDENT_AMBULATORY_CARE_PROVIDER_SITE_OTHER): Payer: Managed Care, Other (non HMO) | Admitting: Obstetrics and Gynecology

## 2021-01-03 ENCOUNTER — Other Ambulatory Visit: Payer: Self-pay

## 2021-01-03 ENCOUNTER — Ambulatory Visit: Payer: Managed Care, Other (non HMO) | Attending: Obstetrics

## 2021-01-03 ENCOUNTER — Other Ambulatory Visit (HOSPITAL_COMMUNITY)
Admission: RE | Admit: 2021-01-03 | Discharge: 2021-01-03 | Disposition: A | Discharge status: Home / Self Care | Payer: Managed Care, Other (non HMO) | Source: Ambulatory Visit | Attending: Obstetrics and Gynecology | Admitting: Obstetrics and Gynecology

## 2021-01-03 VITALS — BP 115/54 | HR 96 | Wt 210.0 lb

## 2021-01-03 DIAGNOSIS — Z3A36 36 weeks gestation of pregnancy: Secondary | ICD-10-CM | POA: Diagnosis not present

## 2021-01-03 DIAGNOSIS — O09293 Supervision of pregnancy with other poor reproductive or obstetric history, third trimester: Secondary | ICD-10-CM | POA: Diagnosis not present

## 2021-01-03 DIAGNOSIS — E669 Obesity, unspecified: Secondary | ICD-10-CM | POA: Diagnosis not present

## 2021-01-03 DIAGNOSIS — O99213 Obesity complicating pregnancy, third trimester: Secondary | ICD-10-CM

## 2021-01-03 DIAGNOSIS — O0990 Supervision of high risk pregnancy, unspecified, unspecified trimester: Secondary | ICD-10-CM | POA: Insufficient documentation

## 2021-01-03 DIAGNOSIS — O09213 Supervision of pregnancy with history of pre-term labor, third trimester: Secondary | ICD-10-CM

## 2021-01-03 DIAGNOSIS — O99013 Anemia complicating pregnancy, third trimester: Secondary | ICD-10-CM

## 2021-01-03 DIAGNOSIS — Z8751 Personal history of pre-term labor: Secondary | ICD-10-CM

## 2021-01-03 DIAGNOSIS — Z6841 Body Mass Index (BMI) 40.0 and over, adult: Secondary | ICD-10-CM

## 2021-01-03 DIAGNOSIS — O359XX0 Maternal care for (suspected) fetal abnormality and damage, unspecified, not applicable or unspecified: Secondary | ICD-10-CM

## 2021-01-03 DIAGNOSIS — O99210 Obesity complicating pregnancy, unspecified trimester: Secondary | ICD-10-CM

## 2021-01-03 DIAGNOSIS — R8271 Bacteriuria: Secondary | ICD-10-CM

## 2021-01-03 NOTE — Progress Notes (Signed)
   PRENATAL VISIT NOTE  Subjective:  Jo Graham is a 35 y.o. [id number] at [redacted]w[redacted]d being seen today for ongoing prenatal care.  She is currently monitored for the following issues for this high-risk pregnancy and has History of preterm delivery; Supervision of high risk pregnancy, antepartum; Echogenic focus of bowel, fetal, affecting care of mother, antepartum; GBS bacteriuria; UTI in pregnancy, antepartum; BMI 40.0-44.9, adult (HCC); Obesity in pregnancy; Anemia in pregnancy, third trimester; and [redacted] weeks gestation of pregnancy on their problem list.  Patient doing well with no acute concerns today. She reports no complaints.  Contractions: Irritability. Vag. Bleeding: None.  Movement: Present. Denies leaking of fluid.   The following portions of the patient's history were reviewed and updated as appropriate: allergies, current medications, past family history, past medical history, past social history, past surgical history and problem list. Problem list updated.  Objective:   Vitals:   01/03/21 1348  BP: (!) 115/54  Pulse: 96  Weight: 210 lb (95.3 kg)    Fetal Status: Fetal Heart Rate (bpm): 145 Fundal Height: 36 cm Movement: Present  Presentation: Vertex  General:  Alert, oriented and cooperative. Patient is in no acute distress.  Skin: Skin is warm and dry. No rash noted.   Cardiovascular: Normal heart rate noted  Respiratory: Normal respiratory effort, no problems with respiration noted  Abdomen: Soft, gravid, appropriate for gestational age.  Pain/Pressure: Absent     Pelvic: Cervical exam performed Dilation: Fingertip Effacement (%): 60 Station: -3  Extremities: Normal range of motion.  Edema: None  Mental Status:  Normal mood and affect. Normal behavior. Normal judgment and thought content.   Assessment and Plan:  Pregnancy: [id number] at [redacted]w[redacted]d  1. [redacted] weeks gestation of pregnancy   2. Supervision of high risk pregnancy, antepartum Continue routine care  - Culture, beta  strep (group b only) - GC/Chlamydia probe amp ()not at [facility]  3. Obesity in pregnancy   4. History of preterm delivery No s/sx of PTL  5. GBS bacteriuria Could not find record of bactiuria this pregnancy so did get vaginal GBS swab  6. Anemia in pregnancy, third trimester   Term labor symptoms and general obstetric precautions including but not limited to vaginal bleeding, contractions, leaking of fluid and fetal movement were reviewed in detail with the patient.  Please refer to After Visit Summary for other counseling recommendations.   Return in about 1 week (around 01/10/2021) for [facility], in person.   Mariel Aloe, MD Faculty Attending Center for [facility]

## 2021-01-04 LAB — GC/CHLAMYDIA PROBE AMP (~~LOC~~) NOT AT ARMC
Chlamydia: NEGATIVE
Comment: NEGATIVE
Comment: NORMAL
Neisseria Gonorrhea: NEGATIVE

## 2021-01-10 ENCOUNTER — Other Ambulatory Visit: Payer: Managed Care, Other (non HMO)

## 2021-01-10 ENCOUNTER — Ambulatory Visit: Payer: Managed Care, Other (non HMO)

## 2021-01-10 ENCOUNTER — Other Ambulatory Visit: Payer: Self-pay

## 2021-01-10 ENCOUNTER — Ambulatory Visit: Payer: Managed Care, Other (non HMO) | Admitting: *Deleted

## 2021-01-10 ENCOUNTER — Ambulatory Visit (INDEPENDENT_AMBULATORY_CARE_PROVIDER_SITE_OTHER): Payer: Managed Care, Other (non HMO)

## 2021-01-10 ENCOUNTER — Encounter: Payer: Self-pay | Admitting: Family Medicine

## 2021-01-10 ENCOUNTER — Ambulatory Visit (INDEPENDENT_AMBULATORY_CARE_PROVIDER_SITE_OTHER): Payer: Managed Care, Other (non HMO) | Admitting: Family Medicine

## 2021-01-10 VITALS — BP 117/71 | HR 102 | Wt 208.9 lb

## 2021-01-10 DIAGNOSIS — O99210 Obesity complicating pregnancy, unspecified trimester: Secondary | ICD-10-CM | POA: Diagnosis not present

## 2021-01-10 DIAGNOSIS — O99013 Anemia complicating pregnancy, third trimester: Secondary | ICD-10-CM

## 2021-01-10 DIAGNOSIS — O0990 Supervision of high risk pregnancy, unspecified, unspecified trimester: Secondary | ICD-10-CM

## 2021-01-10 DIAGNOSIS — Z8751 Personal history of pre-term labor: Secondary | ICD-10-CM

## 2021-01-10 DIAGNOSIS — O2340 Unspecified infection of urinary tract in pregnancy, unspecified trimester: Secondary | ICD-10-CM

## 2021-01-10 DIAGNOSIS — Z3A37 37 weeks gestation of pregnancy: Secondary | ICD-10-CM

## 2021-01-10 LAB — CBC
Hematocrit: 29 % — ABNORMAL LOW (ref 34.0–46.6)
Hemoglobin: 9 g/dL — ABNORMAL LOW (ref 11.1–15.9)
MCH: 23.9 pg — ABNORMAL LOW (ref 26.6–33.0)
MCHC: 31 g/dL — ABNORMAL LOW (ref 31.5–35.7)
MCV: 77 fL — ABNORMAL LOW (ref 79–97)
Platelets: 184 10*3/uL (ref 150–450)
RBC: 3.76 x10E6/uL — ABNORMAL LOW (ref 3.77–5.28)
RDW: 14.9 % (ref 11.7–15.4)
WBC: 8.2 10*3/uL (ref 3.4–10.8)

## 2021-01-10 NOTE — Progress Notes (Signed)
   PRENATAL VISIT NOTE  Subjective:  Jo Graham is a 35 y.o. [id number] at [redacted]w[redacted]d being seen today for ongoing prenatal care.  She is currently monitored for the following issues for this high-risk pregnancy and has History of preterm delivery; Supervision of high risk pregnancy, antepartum; Echogenic focus of bowel, fetal, affecting care of mother, antepartum; GBS bacteriuria; UTI in pregnancy, antepartum; BMI 40.0-44.9, adult (HCC); Obesity in pregnancy; and Anemia in pregnancy, third trimester on their problem list.  Patient reports no complaints.  Contractions: Irritability. Vag. Bleeding: None.  Movement: Present. Denies leaking of fluid.   The following portions of the patient's history were reviewed and updated as appropriate: allergies, current medications, past family history, past medical history, past social history, past surgical history and problem list.   Objective:   Vitals:   01/10/21 1009  BP: 117/71  Pulse: (!) 102  Weight: 208 lb 14.4 oz (94.8 kg)    Fetal Status: Fetal Heart Rate (bpm): 145   Movement: Present     General:  Alert, oriented and cooperative. Patient is in no acute distress.  Skin: Skin is warm and dry. No rash noted.   Cardiovascular: Normal heart rate noted  Respiratory: Normal respiratory effort, no problems with respiration noted  Abdomen: Soft, gravid, appropriate for gestational age.  Pain/Pressure: Present     Pelvic: Cervical exam deferred        Extremities: Normal range of motion.  Edema: None  Mental Status: Normal mood and affect. Normal behavior. Normal judgment and thought content.   Assessment and Plan:  Pregnancy: [id number] at [redacted]w[redacted]d 1. Anemia in pregnancy, third trimester Has not had check in > 2 months, intermittent compliance with Fe - CBC  2. Supervision of high risk pregnancy, antepartum Has [place] with MFM and wrote to "consider IOL at 39 weeks"-- appears that fetal EFW has dropped percentiles from 30%-11%  Discussed possible  outpatient FB and patient is unsure but leaning toward this option.  Plan for IOL on 6/4- requested for AM Care is up to date except has not had anemia follow up - CBC  3. History of preterm delivery Now term  4. Obesity in pregnancy TWG=-11 lb 1.6 oz (-5.035 kg)   5. UTI in pregnancy, antepartum No sx and TOC neg  Term labor symptoms and general obstetric precautions including but not limited to vaginal bleeding, contractions, leaking of fluid and fetal movement were reviewed in detail with the patient. Please refer to After Visit Summary for other counseling recommendations.   Return in about 8 days (around 01/18/2021) for [facility] - As late as possible appt for possible FB placement (IOL on 6/4).  Future Appointments  Date Time Provider Department Center  01/18/2021 10:40 AM Reva Bores, MD [facility]  01/19/2021  6:30 AM [facility] ROOM [facility] None    Federico Flake, MD

## 2021-01-11 LAB — CULTURE, BETA STREP (GROUP B ONLY): Strep Gp B Culture: NEGATIVE

## 2021-01-15 ENCOUNTER — Telehealth: Payer: Self-pay | Admitting: Lactation Services

## 2021-01-15 NOTE — Telephone Encounter (Signed)
-----   Message from Federico Flake, MD sent at 01/11/2021  1:57 PM EDT ----- Continued decreased HGB. Currently within 1 week of IOL. Fe infusion less helpful in this setting. Will increase oral supplement and recommend Fe infusion at admission.

## 2021-01-15 NOTE — Telephone Encounter (Signed)
Called patient with results of most recent CBC.   Patient reports she is not taking her daily iron, she did not pick up. She is taking PNV daily. Advised patient to pick up her iron today and to take daily with OJ for better absorption. Patient voiced understanding.   Reviewed Dr. Alvester Morin does recommend an Iron infusion while in the hospital to help increase Hgb. Patient voiced understanding.

## 2021-01-16 ENCOUNTER — Encounter (HOSPITAL_COMMUNITY): Payer: Self-pay | Admitting: *Deleted

## 2021-01-16 ENCOUNTER — Telehealth (HOSPITAL_COMMUNITY): Payer: Self-pay | Admitting: *Deleted

## 2021-01-16 NOTE — Telephone Encounter (Signed)
Preadmission screen  

## 2021-01-17 ENCOUNTER — Other Ambulatory Visit: Payer: Managed Care, Other (non HMO)

## 2021-01-17 ENCOUNTER — Other Ambulatory Visit (HOSPITAL_COMMUNITY)
Admission: RE | Admit: 2021-01-17 | Discharge: 2021-01-17 | Disposition: A | Discharge status: Home / Self Care | Payer: Managed Care, Other (non HMO) | Source: Ambulatory Visit | Attending: Family Medicine | Admitting: Family Medicine

## 2021-01-17 DIAGNOSIS — Z01812 Encounter for preprocedural laboratory examination: Secondary | ICD-10-CM | POA: Insufficient documentation

## 2021-01-17 DIAGNOSIS — Z20822 Contact with and (suspected) exposure to covid-19: Secondary | ICD-10-CM | POA: Insufficient documentation

## 2021-01-17 LAB — SARS CORONAVIRUS 2 (TAT 6-24 HRS): SARS Coronavirus 2: NEGATIVE

## 2021-01-18 ENCOUNTER — Other Ambulatory Visit: Payer: Self-pay

## 2021-01-18 ENCOUNTER — Ambulatory Visit (INDEPENDENT_AMBULATORY_CARE_PROVIDER_SITE_OTHER): Payer: Managed Care, Other (non HMO) | Admitting: Family Medicine

## 2021-01-18 VITALS — BP 116/70 | HR 102 | Wt 209.1 lb

## 2021-01-18 DIAGNOSIS — O0990 Supervision of high risk pregnancy, unspecified, unspecified trimester: Secondary | ICD-10-CM

## 2021-01-18 DIAGNOSIS — R8271 Bacteriuria: Secondary | ICD-10-CM

## 2021-01-18 DIAGNOSIS — Z8751 Personal history of pre-term labor: Secondary | ICD-10-CM

## 2021-01-18 NOTE — Patient Instructions (Signed)

## 2021-01-18 NOTE — Progress Notes (Signed)
   PRENATAL VISIT NOTE  Subjective:  Jo Graham is a 35 y.o. [id number] at [redacted]w[redacted]d being seen today for ongoing prenatal care.  She is currently monitored for the following issues for this high-risk pregnancy and has History of preterm delivery; Supervision of high risk pregnancy, antepartum; Echogenic focus of bowel, fetal, affecting care of mother, antepartum; GBS bacteriuria; UTI in pregnancy, antepartum; BMI 40.0-44.9, adult (HCC); Obesity in pregnancy; and Anemia in pregnancy, third trimester on their problem list.  Patient reports no complaints.  Contractions: Irritability. Vag. Bleeding: None.  Movement: Present. Denies leaking of fluid.   The following portions of the patient's history were reviewed and updated as appropriate: allergies, current medications, past family history, past medical history, past social history, past surgical history and problem list.   Objective:   Vitals:   01/18/21 1104  BP: 116/70  Pulse: (!) 102  Weight: 209 lb 1.6 oz (94.8 kg)    Fetal Status: Fetal Heart Rate (bpm): 144   Movement: Present  Presentation: Vertex  General:  Alert, oriented and cooperative. Patient is in no acute distress.  Skin: Skin is warm and dry. No rash noted.   Cardiovascular: Normal heart rate noted  Respiratory: Normal respiratory effort, no problems with respiration noted  Abdomen: Soft, gravid, appropriate for gestational age.  Pain/Pressure: Present     Pelvic: Cervical exam performed in the presence of a chaperone Dilation: 1 Effacement (%): 50 Station: -3  Extremities: Normal range of motion.  Edema: None  Mental Status: Normal mood and affect. Normal behavior. Normal judgment and thought content.   Assessment and Plan:  Pregnancy: H1T0569 at [redacted]w[redacted]d 1. Supervision of high risk pregnancy, antepartum For IOL tomorrow.  2. GBS bacteriuria Needs treatment in labor  3. History of preterm delivery Completed 17P  Preterm labor symptoms and general obstetric  precautions including but not limited to vaginal bleeding, contractions, leaking of fluid and fetal movement were reviewed in detail with the patient. Please refer to After Visit Summary for other counseling recommendations.   Return in about 4 weeks (around 02/15/2021) for pp check.  Future Appointments  Date Time Provider Department Center  01/19/2021  6:30 AM [facility] SCHED ROOM [facility] None  02/20/2021 10:35 AM Warden Fillers, MD [facility]    Reva Bores, MD

## 2021-01-19 ENCOUNTER — Inpatient Hospital Stay (HOSPITAL_COMMUNITY)
Admission: AD | Admit: 2021-01-19 | Discharge: 2021-01-21 | DRG: 798 | Disposition: A | Discharge status: Home / Self Care | Payer: Managed Care, Other (non HMO) | Attending: Obstetrics and Gynecology | Admitting: Obstetrics and Gynecology

## 2021-01-19 ENCOUNTER — Encounter (HOSPITAL_COMMUNITY): Payer: Self-pay | Admitting: Family Medicine

## 2021-01-19 ENCOUNTER — Other Ambulatory Visit: Payer: Self-pay

## 2021-01-19 ENCOUNTER — Inpatient Hospital Stay (HOSPITAL_COMMUNITY): Payer: Managed Care, Other (non HMO)

## 2021-01-19 DIAGNOSIS — O9902 Anemia complicating childbirth: Secondary | ICD-10-CM | POA: Diagnosis present

## 2021-01-19 DIAGNOSIS — O99013 Anemia complicating pregnancy, third trimester: Secondary | ICD-10-CM | POA: Diagnosis present

## 2021-01-19 DIAGNOSIS — Z6841 Body Mass Index (BMI) 40.0 and over, adult: Secondary | ICD-10-CM

## 2021-01-19 DIAGNOSIS — O358XX0 Maternal care for other (suspected) fetal abnormality and damage, not applicable or unspecified: Secondary | ICD-10-CM | POA: Diagnosis present

## 2021-01-19 DIAGNOSIS — Z20822 Contact with and (suspected) exposure to covid-19: Secondary | ICD-10-CM | POA: Diagnosis present

## 2021-01-19 DIAGNOSIS — O99824 Streptococcus B carrier state complicating childbirth: Secondary | ICD-10-CM | POA: Diagnosis present

## 2021-01-19 DIAGNOSIS — Z302 Encounter for sterilization: Secondary | ICD-10-CM

## 2021-01-19 DIAGNOSIS — O35DXX0 Maternal care for other (suspected) fetal abnormality and damage, fetal gastrointestinal anomalies, not applicable or unspecified: Secondary | ICD-10-CM | POA: Diagnosis present

## 2021-01-19 DIAGNOSIS — O365930 Maternal care for other known or suspected poor fetal growth, third trimester, not applicable or unspecified: Secondary | ICD-10-CM | POA: Diagnosis present

## 2021-01-19 DIAGNOSIS — Z9851 Tubal ligation status: Secondary | ICD-10-CM

## 2021-01-19 DIAGNOSIS — Z3A39 39 weeks gestation of pregnancy: Secondary | ICD-10-CM | POA: Diagnosis not present

## 2021-01-19 DIAGNOSIS — R8271 Bacteriuria: Secondary | ICD-10-CM | POA: Diagnosis present

## 2021-01-19 LAB — CBC
HCT: 29.9 % — ABNORMAL LOW (ref 36.0–46.0)
Hemoglobin: 9.3 g/dL — ABNORMAL LOW (ref 12.0–15.0)
MCH: 24.2 pg — ABNORMAL LOW (ref 26.0–34.0)
MCHC: 31.1 g/dL (ref 30.0–36.0)
MCV: 77.9 fL — ABNORMAL LOW (ref 80.0–100.0)
Platelets: 238 10*3/uL (ref 150–400)
RBC: 3.84 MIL/uL — ABNORMAL LOW (ref 3.87–5.11)
RDW: 15.4 % (ref 11.5–15.5)
WBC: 9.5 10*3/uL (ref 4.0–10.5)
nRBC: 0.3 % — ABNORMAL HIGH (ref 0.0–0.2)

## 2021-01-19 LAB — TYPE AND SCREEN
ABO/RH(D): O POS
Antibody Screen: NEGATIVE

## 2021-01-19 MED ORDER — PHENYLEPHRINE 40 MCG/ML (10ML) SYRINGE FOR IV PUSH (FOR BLOOD PRESSURE SUPPORT)
80.0000 ug | PREFILLED_SYRINGE | INTRAVENOUS | Status: DC | PRN
  Administered 2021-01-20: 80 ug via INTRAVENOUS

## 2021-01-19 MED ORDER — METHYLPREDNISOLONE SODIUM SUCC 125 MG IJ SOLR: 125.0000 mg | Freq: Once | INTRAMUSCULAR | Status: DC | PRN

## 2021-01-19 MED ORDER — FENTANYL-BUPIVACAINE-NACL 0.5-0.125-0.9 MG/250ML-% EP SOLN
12.0000 mL/h | EPIDURAL | Status: DC | PRN
  Administered 2021-01-20: 12 mL/h via EPIDURAL
  Filled 2021-01-19: qty 250

## 2021-01-19 MED ORDER — SODIUM CHLORIDE 0.9 % IV BOLUS
500.0000 mL | Freq: Once | INTRAVENOUS | Status: DC | PRN
Start: 2021-01-19 — End: 2021-01-20

## 2021-01-19 MED ORDER — IRON SUCROSE 20 MG/ML IV SOLN
500.0000 mg | Freq: Once | INTRAVENOUS | Status: DC
  Filled 2021-01-19: qty 25

## 2021-01-19 MED ORDER — SOD CITRATE-CITRIC ACID 500-334 MG/5ML PO SOLN: 30.0000 mL | ORAL | Status: DC | PRN

## 2021-01-19 MED ORDER — PHENYLEPHRINE 40 MCG/ML (10ML) SYRINGE FOR IV PUSH (FOR BLOOD PRESSURE SUPPORT)
80.0000 ug | PREFILLED_SYRINGE | INTRAVENOUS | Status: DC | PRN
  Filled 2021-01-19: qty 10

## 2021-01-19 MED ORDER — LIDOCAINE HCL (PF) 1 % IJ SOLN: 30.0000 mL | INTRAMUSCULAR | Status: DC | PRN

## 2021-01-19 MED ORDER — DIPHENHYDRAMINE HCL 50 MG/ML IJ SOLN: 12.5000 mg | INTRAMUSCULAR | Status: DC | PRN

## 2021-01-19 MED ORDER — EPINEPHRINE PF 1 MG/ML IJ SOLN
0.3000 mg | Freq: Once | INTRAMUSCULAR | Status: DC | PRN
  Filled 2021-01-19: qty 1

## 2021-01-19 MED ORDER — SODIUM CHLORIDE 0.9 % IV SOLN
5.0000 10*6.[IU] | Freq: Once | INTRAVENOUS | Status: AC
  Administered 2021-01-19: 5 10*6.[IU] via INTRAVENOUS
  Filled 2021-01-19: qty 5

## 2021-01-19 MED ORDER — OXYCODONE-ACETAMINOPHEN 5-325 MG PO TABS: 1.0000 | ORAL_TABLET | ORAL | Status: DC | PRN

## 2021-01-19 MED ORDER — LACTATED RINGERS IV SOLN: INTRAVENOUS | Status: DC

## 2021-01-19 MED ORDER — DIPHENHYDRAMINE HCL 50 MG/ML IJ SOLN: 25.0000 mg | Freq: Once | INTRAMUSCULAR | Status: DC | PRN

## 2021-01-19 MED ORDER — PENICILLIN G POT IN DEXTROSE 60000 UNIT/ML IV SOLN
3.0000 10*6.[IU] | INTRAVENOUS | Status: DC
  Administered 2021-01-19 – 2021-01-20 (×2): 3 10*6.[IU] via INTRAVENOUS
  Filled 2021-01-19 (×2): qty 50

## 2021-01-19 MED ORDER — TERBUTALINE SULFATE 1 MG/ML IJ SOLN: 0.2500 mg | Freq: Once | INTRAMUSCULAR | Status: DC | PRN

## 2021-01-19 MED ORDER — MISOPROSTOL 25 MCG QUARTER TABLET: 25.0000 ug | ORAL_TABLET | ORAL | Status: DC | PRN

## 2021-01-19 MED ORDER — EPHEDRINE 5 MG/ML INJ: 10.0000 mg | INTRAVENOUS | Status: DC | PRN

## 2021-01-19 MED ORDER — ACETAMINOPHEN 325 MG PO TABS
650.0000 mg | ORAL_TABLET | ORAL | Status: DC | PRN
  Administered 2021-01-19: 650 mg via ORAL
  Filled 2021-01-19: qty 2

## 2021-01-19 MED ORDER — SODIUM CHLORIDE 0.9 % IV SOLN: INTRAVENOUS | Status: DC | PRN

## 2021-01-19 MED ORDER — ALBUTEROL SULFATE (2.5 MG/3ML) 0.083% IN NEBU: 2.5000 mg | INHALATION_SOLUTION | Freq: Once | RESPIRATORY_TRACT | Status: DC | PRN

## 2021-01-19 MED ORDER — MISOPROSTOL 50MCG HALF TABLET
50.0000 ug | ORAL_TABLET | ORAL | Status: DC | PRN
  Administered 2021-01-19 (×2): 50 ug via BUCCAL
  Filled 2021-01-19 (×2): qty 1

## 2021-01-19 MED ORDER — OXYTOCIN-SODIUM CHLORIDE 30-0.9 UT/500ML-% IV SOLN
2.5000 [IU]/h | INTRAVENOUS | Status: DC
  Filled 2021-01-19: qty 500

## 2021-01-19 MED ORDER — LACTATED RINGERS IV SOLN
500.0000 mL | Freq: Once | INTRAVENOUS | Status: AC
  Administered 2021-01-20: 500 mL via INTRAVENOUS

## 2021-01-19 MED ORDER — ONDANSETRON HCL 4 MG/2ML IJ SOLN: 4.0000 mg | Freq: Four times a day (QID) | INTRAMUSCULAR | Status: DC | PRN

## 2021-01-19 MED ORDER — LACTATED RINGERS IV SOLN: 500.0000 mL | INTRAVENOUS | Status: DC | PRN

## 2021-01-19 MED ORDER — OXYTOCIN BOLUS FROM INFUSION
333.0000 mL | Freq: Once | INTRAVENOUS | Status: AC
  Administered 2021-01-20: 333 mL via INTRAVENOUS

## 2021-01-19 MED ORDER — OXYCODONE-ACETAMINOPHEN 5-325 MG PO TABS: 2.0000 | ORAL_TABLET | ORAL | Status: DC | PRN

## 2021-01-19 NOTE — Progress Notes (Signed)
Labor Progress Note Jo Graham is a 35 y.o. [phone] at [redacted]w[redacted]d presented for IOL for SGA (11%ile at [redacted]w[redacted]d)  S: Feeling some cramps   O:  BP 122/64   Pulse 98   Temp 98.1 F (36.7 C) (Axillary)   Resp 20   Ht 5\' 1"  (1.549 m)   Wt 94.9 kg   LMP 04/21/2020 (Exact Date)   BMI 39.53 kg/m  EFM: baseline 145/mod variability/pos accels/ no decels   CVE: Dilation: 1 Effacement (%): Thick Cervical Position: Middle Station: -1 Presentation: Vertex Exam by:: Dr. [email or web address]   A&P: 35 y.o. 34 [redacted]w[redacted]d here for IOL for SGA #Labor: s/p cytotec. Attempted FB insertion w spec, only tolerated inflation to 30 cc. Will monitor and give additional cytotec.  #Pain: per patient request #FWB: cat I  #GBS positive, give PCN #Anemia: Admit hgb 9.3. Venofer postpartum.  [redacted]w[redacted]d, MD 8:58 PM

## 2021-01-19 NOTE — H&P (Signed)
Jo Graham is a 35 y.o. [phone] female at [redacted]w[redacted]d presenting for IOL for SGA (11th %ile at [redacted]w[redacted]d). Echogenic bowel previously seen on U/S but has resolved.  OB History    Gravida  5   Para  2   Term  1   Preterm  1   AB  2   Living  2     SAB  0   IAB  0   Ectopic  1   Multiple  0   Live Births  2          Past Medical History:  Diagnosis Date  . H/O candidiasis   . H/O cystitis    x1   Past Surgical History:  Procedure Laterality Date  . DILATION AND EVACUATION N/A 01/25/2020   Procedure: DILATATION AND EVACUATION;  Surgeon: Fairview Bing, MD;  Location: [facility];  Service: Gynecology;  Laterality: N/A;   Family History: family history includes Asthma in her maternal aunt; Cancer in her maternal aunt. Social History:  reports that she has never smoked. She has never used smokeless tobacco. She reports that she does not drink alcohol and does not use drugs.    Maternal Diabetes: No Genetic Screening: Normal Maternal Ultrasounds/Referrals: Echogenic bowel  (resolved) Fetal Ultrasounds or other Referrals:  None Maternal Substance Abuse:  No Significant Maternal Medications:  None Significant Maternal Lab Results:  Group B Strep positive Other Comments:  None  Pertinent items noted in HPI and remainder of comprehensive ROS otherwise negative.  Maternal Medical History:  Contractions: Frequency: irregular.   Perceived severity is mild.    Fetal activity: Perceived fetal activity is normal.   Last perceived fetal movement was within the past hour.    Prenatal Complications - Diabetes: none.    Dilation: 1 Effacement (%): 50 Station: -2 Exam by:: J.Alica Shellhammer, CNM Blood pressure 117/72, pulse 84, temperature 98.1 F (36.7 C), temperature source Axillary, resp. rate 20, height 5\' 1"  (1.549 m), weight 209 lb 3.2 oz (94.9 kg), last menstrual period 04/21/2020, unknown if currently breastfeeding. Maternal Exam:  Uterine Assessment:  Contraction strength is mild.  Contraction frequency is irregular.   Abdomen: Patient reports no abdominal tenderness. Fetal presentation: vertex  Introitus: Normal vulva. Normal vagina.  Pelvis: adequate for delivery.   Cervix: Cervix evaluated by sterile speculum exam.   Dilation: 1 Effacement (%): 50 Station: -2 Presentation: Vertex Exam by:: J.Braxtyn Bojarski, CNM    Fetal Exam Fetal Monitor Review: Mode: ultrasound.   Baseline rate: 135.  Variability: moderate (6-25 bpm).   Pattern: accelerations present and no decelerations.    Fetal State Assessment: Category I - tracings are normal.     Physical Exam Vitals and nursing note reviewed. Exam conducted with a chaperone present.  Constitutional:      General: She is not in acute distress.    Appearance: Normal appearance. She is not ill-appearing.  HENT:     Head: Normocephalic and atraumatic.     Mouth/Throat:     Mouth: Mucous membranes are moist.  Eyes:     Pupils: Pupils are equal, round, and reactive to light.  Cardiovascular:     Rate and Rhythm: Normal rate and regular rhythm.     Pulses: Normal pulses.  Pulmonary:     Effort: Pulmonary effort is normal.  Abdominal:     General: There is no distension.     Palpations: Abdomen is soft.     Tenderness: There is no abdominal tenderness.  Genitourinary:  General: Normal vulva.  Musculoskeletal:        General: Normal range of motion.     Cervical back: Normal range of motion.  Skin:    General: Skin is warm.     Capillary Refill: Capillary refill takes less than 2 seconds.  Neurological:     Mental Status: She is alert and oriented to person, place, and time.  Psychiatric:        Mood and Affect: Mood normal.        Behavior: Behavior normal.        Thought Content: Thought content normal.        Judgment: Judgment normal.     Prenatal labs: ABO, Rh: --/--/O POS (06/04 1410) Antibody: NEG (06/04 1410) Rubella: 7.27 (11/10 0948) RPR: Non Reactive (03/17  0853)  HBsAg: Negative (11/10 0948)  HIV: Non Reactive (03/17 0853)  GBS: Negative/-- (05/19 1416)   Assessment/Plan: Admit to L&D for IOL Cytotec then FB at next check (per pt request) GBS+ bacteruria at initial [facility] appt, 36wk swab unnecessary, treating with PCN Hgb on admission 9.3, will wait for Fe infusion until after delivery on postpartum   Bernerd Limbo 01/19/2021, 5:39 PM

## 2021-01-20 ENCOUNTER — Encounter (HOSPITAL_COMMUNITY): Payer: Self-pay | Admitting: Family Medicine

## 2021-01-20 ENCOUNTER — Inpatient Hospital Stay (HOSPITAL_COMMUNITY): Payer: Managed Care, Other (non HMO) | Admitting: Anesthesiology

## 2021-01-20 ENCOUNTER — Encounter (HOSPITAL_COMMUNITY)
Admission: AD | Disposition: A | Discharge status: Home / Self Care | Payer: Self-pay | Source: Home / Self Care | Attending: Obstetrics and Gynecology

## 2021-01-20 DIAGNOSIS — Z302 Encounter for sterilization: Secondary | ICD-10-CM

## 2021-01-20 DIAGNOSIS — O365930 Maternal care for other known or suspected poor fetal growth, third trimester, not applicable or unspecified: Secondary | ICD-10-CM

## 2021-01-20 DIAGNOSIS — O99824 Streptococcus B carrier state complicating childbirth: Secondary | ICD-10-CM

## 2021-01-20 DIAGNOSIS — Z3A39 39 weeks gestation of pregnancy: Secondary | ICD-10-CM

## 2021-01-20 HISTORY — PX: TUBAL LIGATION: SHX77

## 2021-01-20 LAB — RPR: RPR Ser Ql: NONREACTIVE

## 2021-01-20 LAB — CBC
HCT: 27.8 % — ABNORMAL LOW (ref 36.0–46.0)
Hemoglobin: 8.4 g/dL — ABNORMAL LOW (ref 12.0–15.0)
MCH: 23.6 pg — ABNORMAL LOW (ref 26.0–34.0)
MCHC: 30.2 g/dL (ref 30.0–36.0)
MCV: 78.1 fL — ABNORMAL LOW (ref 80.0–100.0)
Platelets: 212 10*3/uL (ref 150–400)
RBC: 3.56 MIL/uL — ABNORMAL LOW (ref 3.87–5.11)
RDW: 15.3 % (ref 11.5–15.5)
WBC: 12.4 10*3/uL — ABNORMAL HIGH (ref 4.0–10.5)
nRBC: 0 % (ref 0.0–0.2)

## 2021-01-20 SURGERY — LIGATION, FALLOPIAN TUBE, POSTPARTUM
Anesthesia: Epidural | Wound class: Clean Contaminated

## 2021-01-20 MED ORDER — BENZOCAINE-MENTHOL 20-0.5 % EX AERO: 1.0000 "application " | INHALATION_SPRAY | CUTANEOUS | Status: DC | PRN

## 2021-01-20 MED ORDER — SODIUM CHLORIDE 0.9 % IV SOLN
500.0000 mg | Freq: Once | INTRAVENOUS | Status: AC
  Administered 2021-01-20: 500 mg via INTRAVENOUS
  Filled 2021-01-20: qty 25

## 2021-01-20 MED ORDER — SENNOSIDES-DOCUSATE SODIUM 8.6-50 MG PO TABS: 2.0000 | ORAL_TABLET | Freq: Every day | ORAL | Status: DC

## 2021-01-20 MED ORDER — FAMOTIDINE 20 MG PO TABS
40.0000 mg | ORAL_TABLET | Freq: Once | ORAL | Status: AC
Start: 2021-01-20 — End: 2021-01-20
  Administered 2021-01-20: 40 mg via ORAL
  Filled 2021-01-20: qty 2

## 2021-01-20 MED ORDER — DEXMEDETOMIDINE (PRECEDEX) IN NS 20 MCG/5ML (4 MCG/ML) IV SYRINGE
PREFILLED_SYRINGE | INTRAVENOUS | Status: DC | PRN
  Administered 2021-01-20: 20 ug via INTRAVENOUS

## 2021-01-20 MED ORDER — TERBUTALINE SULFATE 1 MG/ML IJ SOLN: 0.2500 mg | Freq: Once | INTRAMUSCULAR | Status: DC | PRN

## 2021-01-20 MED ORDER — ACETAMINOPHEN 325 MG PO TABS
650.0000 mg | ORAL_TABLET | ORAL | Status: DC | PRN
  Administered 2021-01-20 – 2021-01-21 (×2): 650 mg via ORAL
  Filled 2021-01-20 (×2): qty 2

## 2021-01-20 MED ORDER — TRANEXAMIC ACID-NACL 1000-0.7 MG/100ML-% IV SOLN
INTRAVENOUS | Status: AC
  Filled 2021-01-20: qty 100

## 2021-01-20 MED ORDER — OXYCODONE HCL 5 MG PO TABS: 5.0000 mg | ORAL_TABLET | Freq: Once | ORAL | Status: DC | PRN

## 2021-01-20 MED ORDER — IBUPROFEN 600 MG PO TABS
600.0000 mg | ORAL_TABLET | Freq: Four times a day (QID) | ORAL | Status: DC
  Administered 2021-01-20 – 2021-01-21 (×4): 600 mg via ORAL
  Filled 2021-01-20 (×4): qty 1

## 2021-01-20 MED ORDER — LIDOCAINE HCL (PF) 1 % IJ SOLN
INTRAMUSCULAR | Status: DC | PRN
  Administered 2021-01-20: 5 mL via EPIDURAL
  Administered 2021-01-20: 2 mL via EPIDURAL

## 2021-01-20 MED ORDER — METOCLOPRAMIDE HCL 10 MG PO TABS
10.0000 mg | ORAL_TABLET | Freq: Once | ORAL | Status: AC
  Administered 2021-01-20: 10 mg via ORAL
  Filled 2021-01-20: qty 1

## 2021-01-20 MED ORDER — KETOROLAC TROMETHAMINE 30 MG/ML IJ SOLN
INTRAMUSCULAR | Status: DC | PRN
  Administered 2021-01-20: 30 mg via INTRAVENOUS

## 2021-01-20 MED ORDER — LACTATED RINGERS IV SOLN: INTRAVENOUS | Status: DC

## 2021-01-20 MED ORDER — BUPIVACAINE HCL (PF) 0.25 % IJ SOLN
INTRAMUSCULAR | Status: DC | PRN
  Administered 2021-01-20: 20 mL
  Administered 2021-01-20: 10 mL

## 2021-01-20 MED ORDER — OXYCODONE-ACETAMINOPHEN 5-325 MG PO TABS: 1.0000 | ORAL_TABLET | Freq: Four times a day (QID) | ORAL | Status: DC | PRN

## 2021-01-20 MED ORDER — DIBUCAINE (PERIANAL) 1 % EX OINT
1.0000 | TOPICAL_OINTMENT | CUTANEOUS | Status: DC | PRN
Start: 2021-01-20 — End: 2021-01-21

## 2021-01-20 MED ORDER — PRENATAL MULTIVITAMIN CH: 1.0000 | ORAL_TABLET | Freq: Every day | ORAL | Status: DC

## 2021-01-20 MED ORDER — BUPIVACAINE HCL (PF) 0.25 % IJ SOLN
INTRAMUSCULAR | Status: AC
  Filled 2021-01-20: qty 30

## 2021-01-20 MED ORDER — OXYCODONE HCL 5 MG/5ML PO SOLN: 5.0000 mg | Freq: Once | ORAL | Status: DC | PRN

## 2021-01-20 MED ORDER — HYDROMORPHONE HCL 1 MG/ML IJ SOLN: 0.2500 mg | INTRAMUSCULAR | Status: DC | PRN

## 2021-01-20 MED ORDER — DEXMEDETOMIDINE (PRECEDEX) IN NS 20 MCG/5ML (4 MCG/ML) IV SYRINGE
PREFILLED_SYRINGE | INTRAVENOUS | Status: AC
  Filled 2021-01-20: qty 5

## 2021-01-20 MED ORDER — DIPHENHYDRAMINE HCL 25 MG PO CAPS: 25.0000 mg | ORAL_CAPSULE | Freq: Four times a day (QID) | ORAL | Status: DC | PRN

## 2021-01-20 MED ORDER — KETOROLAC TROMETHAMINE 30 MG/ML IJ SOLN
INTRAMUSCULAR | Status: AC
  Filled 2021-01-20: qty 1

## 2021-01-20 MED ORDER — LACTATED RINGERS IV SOLN: INTRAVENOUS | Status: DC | PRN

## 2021-01-20 MED ORDER — COCONUT OIL OIL: 1.0000 "application " | TOPICAL_OIL | Status: DC | PRN

## 2021-01-20 MED ORDER — LIDOCAINE-EPINEPHRINE (PF) 2 %-1:200000 IJ SOLN
INTRAMUSCULAR | Status: DC | PRN
  Administered 2021-01-20 (×2): 5 mg via INTRADERMAL
  Administered 2021-01-20: 10 mg via INTRADERMAL

## 2021-01-20 MED ORDER — ONDANSETRON HCL 4 MG/2ML IJ SOLN
4.0000 mg | INTRAMUSCULAR | Status: DC | PRN
Start: 2021-01-20 — End: 2021-01-21

## 2021-01-20 MED ORDER — ZOLPIDEM TARTRATE 5 MG PO TABS: 5.0000 mg | ORAL_TABLET | Freq: Every evening | ORAL | Status: DC | PRN

## 2021-01-20 MED ORDER — FENTANYL CITRATE (PF) 100 MCG/2ML IJ SOLN
INTRAMUSCULAR | Status: DC | PRN
  Administered 2021-01-20: 100 ug via INTRAVENOUS

## 2021-01-20 MED ORDER — WITCH HAZEL-GLYCERIN EX PADS: 1.0000 "application " | MEDICATED_PAD | CUTANEOUS | Status: DC | PRN

## 2021-01-20 MED ORDER — FENTANYL CITRATE (PF) 100 MCG/2ML IJ SOLN
INTRAMUSCULAR | Status: AC
  Filled 2021-01-20: qty 2

## 2021-01-20 MED ORDER — MISOPROSTOL 200 MCG PO TABS
800.0000 ug | ORAL_TABLET | Freq: Once | ORAL | Status: AC
  Administered 2021-01-20: 800 ug via ORAL
  Filled 2021-01-20: qty 4

## 2021-01-20 MED ORDER — PROMETHAZINE HCL 25 MG/ML IJ SOLN: 6.2500 mg | INTRAMUSCULAR | Status: DC | PRN

## 2021-01-20 MED ORDER — ONDANSETRON HCL 4 MG PO TABS: 4.0000 mg | ORAL_TABLET | ORAL | Status: DC | PRN

## 2021-01-20 MED ORDER — OXYTOCIN-SODIUM CHLORIDE 30-0.9 UT/500ML-% IV SOLN
1.0000 m[IU]/min | INTRAVENOUS | Status: DC
  Administered 2021-01-20: 2 m[IU]/min via INTRAVENOUS

## 2021-01-20 MED ORDER — SODIUM CHLORIDE 0.9 % IR SOLN
Status: DC | PRN
  Administered 2021-01-20: 1

## 2021-01-20 MED ORDER — SIMETHICONE 80 MG PO CHEW: 80.0000 mg | CHEWABLE_TABLET | ORAL | Status: DC | PRN

## 2021-01-20 SURGICAL SUPPLY — 25 items
BENZOIN TINCTURE PRP APPL 2/3 (GAUZE/BANDAGES/DRESSINGS) ×2 IMPLANT
CLIP FILSHIE TUBAL LIGA STRL (Clip) ×1 IMPLANT
CLOTH BEACON ORANGE TIMEOUT ST (SAFETY) ×2 IMPLANT
DRESSING OPSITE X SMALL 2X3 (GAUZE/BANDAGES/DRESSINGS) ×2 IMPLANT
DRSG OPSITE POSTOP 3X4 (GAUZE/BANDAGES/DRESSINGS) ×2 IMPLANT
DURAPREP 26ML APPLICATOR (WOUND CARE) ×2 IMPLANT
GLOVE BIO SURGEON STRL SZ7.5 (GLOVE) ×2 IMPLANT
GLOVE BIOGEL PI IND STRL 7.0 (GLOVE) ×2 IMPLANT
GLOVE BIOGEL PI INDICATOR 7.0 (GLOVE) ×2
GOWN STRL REUS W/TWL LRG LVL3 (GOWN DISPOSABLE) ×2 IMPLANT
GOWN STRL REUS W/TWL XL LVL3 (GOWN DISPOSABLE) ×2 IMPLANT
NEEDLE HYPO 22GX1.5 SAFETY (NEEDLE) ×2 IMPLANT
NS IRRIG 1000ML POUR BTL (IV SOLUTION) ×2 IMPLANT
PACK ABDOMINAL MINOR (CUSTOM PROCEDURE TRAY) ×2 IMPLANT
PROTECTOR NERVE ULNAR (MISCELLANEOUS) ×2 IMPLANT
SPONGE LAP 4X18 RFD (DISPOSABLE) IMPLANT
STRIP CLOSURE SKIN 1/4X4 (GAUZE/BANDAGES/DRESSINGS) ×2 IMPLANT
SUT MNCRL AB 4-0 PS2 18 (SUTURE) ×2 IMPLANT
SUT PLAIN 0 NONE (SUTURE) ×2 IMPLANT
SUT VIC AB 0 CT1 27 (SUTURE) ×2
SUT VIC AB 0 CT1 27XBRD ANBCTR (SUTURE) ×1 IMPLANT
SYR CONTROL 10ML LL (SYRINGE) ×2 IMPLANT
TOWEL OR 17X24 6PK STRL BLUE (TOWEL DISPOSABLE) ×4 IMPLANT
TRAY FOLEY CATH SILVER 14FR (SET/KITS/TRAYS/PACK) ×2 IMPLANT
WATER STERILE IRR 1000ML POUR (IV SOLUTION) ×2 IMPLANT

## 2021-01-20 NOTE — Discharge Summary (Signed)
Postpartum Discharge Summary     Patient Name: Jo Graham DOB: [birth year] MRN: [id number]  Date of admission: 01/19/2021 Delivery date:01/20/2021  Delivering provider: Dulce Sellar B  Date of discharge: 01/21/2021  Admitting diagnosis: Small for gestational age [P33.10] Intrauterine pregnancy: [redacted]w[redacted]d    Secondary diagnosis:  Principal Problem:   Vaginal delivery Active Problems:   Echogenic focus of bowel, fetal, affecting care of mother, antepartum   GBS bacteriuria   [place] 40.0[place]44.9[place]   Anemia in pregnancy, third trimester   Small for gestational age   History of tubal ligation  Additional problems: as noted above   Discharge diagnosis: Term Pregnancy Delivered                                              Post partum procedures:postpartum tubal ligation   Augmentation: Cytotec and IP Foley Complications: None  Hospital course: Induction of Labor With Vaginal Delivery   35y.o. yo [id number] admitted to the hospital 01/19/2021 for induction of labor.  Indication for induction: SGA.  Patient had an uncomplicated labor course as follows: Membrane Rupture Time/Date: 3:37 AM ,01/20/2021   Delivery Method:Vaginal, Spontaneous  Episiotomy: None  Lacerations:  None  Details of delivery can be found in separate delivery note.  Patient had a routine postpartum course. She received IV venofer for anemia and was discharged on PO iron. She also had a BTL on 6/5. Patient is discharged home 01/21/21.  Newborn Data: Birth date:01/20/2021  Birth time:3:40 AM  Gender:Female  Living status:Living  Apgars:8 ,9  Weight:2890 g   Magnesium Sulfate received: No BMZ received: No Rhophylac:N/A MMR:N/A T-DaP:Given prenatally Flu: No Transfusion:Yes (IV venofer)  Physical exam  Vitals:   01/20/21 1700 01/20/21 1923 01/20/21 2006 01/20/21 2322  BP: 118/78 130/87  124/78  Pulse: 70 (!) 108  96  Resp: 18   18  Temp: 99 F (37.2 C)  99.1 F (37.3 C) 99.3 F (37.4  C)  TempSrc: Oral  Oral Tympanic  SpO2: 100%   100%  Weight:      Height:       General: alert, cooperative and no distress Lochia: appropriate Uterine Fundus: firm Incision: honeycomb dressing clean/dry/intact DVT Evaluation: No evidence of DVT seen on physical exam. No cords or calf tenderness. No significant calf/ankle edema. Labs: Lab Results  Component Value Date   WBC 12.3 (H) 01/21/2021   HGB 7.5 (L) 01/21/2021   HCT 24.0 (L) 01/21/2021   MCV 78.4 (L) 01/21/2021   PLT 213 01/21/2021   CMP Latest Ref Rng & Units 12/23/2019  Glucose 70 - 99 mg/dL 88  BUN 6 - 20 mg/dL 5(L)  Creatinine 0.44 - 1.00 mg/dL 0.63  Sodium 135 - 145 mmol/L 135  Potassium 3.5 - 5.1 mmol/L 3.5  Chloride 98 - 111 mmol/L 101  CO2 22 - 32 mmol/L 20(L)  Calcium 8.9 - 10.3 mg/dL 9.2  Total Protein 6.5 - 8.1 g/dL 7.2  Total Bilirubin 0.3 - 1.2 mg/dL 0.7  Alkaline Phos 38 - 126 U/L 59  AST 15 - 41 U/L 18  ALT 0 - 44 U/L 20   Edinburgh Score: No flowsheet data found.   After visit meds:  Allergies as of 01/21/2021   No Known Allergies     Medication List    STOP taking these medications  Blood Pressure Kit Devi   HYDROXYprogesterone caproate autoinjector Commonly known as: Makena     TAKE these medications   acetaminophen 325 MG tablet Commonly known as: Tylenol Take 2 tablets (650 mg total) by mouth every 6 (six) hours as needed for mild pain, moderate pain, fever or headache (for pain scale < 4).   coconut oil Oil Apply 1 application topically as needed (nipple pain).   ferrous sulfate 325 (65 FE) MG EC tablet Take 1 tablet (325 mg total) by mouth every other day.   ibuprofen 600 MG tablet Commonly known as: ADVIL Take 1 tablet (600 mg total) by mouth every 6 (six) hours.   multivitamin-prenatal 27-0.8 MG Tabs tablet Take 1 tablet by mouth daily at 12 noon.            Discharge Care Instructions  (From admission, onward)         Start     Ordered   01/21/21 0000   No dressing needed        01/21/21 1109           Discharge home in stable condition Infant Feeding: Breast Infant Disposition:home with mother Discharge instruction: per After Visit Summary and Postpartum booklet. Activity: Advance as tolerated. Pelvic rest for 6 weeks.  Diet: routine diet Future Appointments: Future Appointments  Date Time Provider [place]  02/20/2021 10:35 AM Griffin Basil, MD [facility]   Follow up Visit:  Please schedule this patient for a In person postpartum visit in 4 weeks with the following provider: Any provider. Additional Postpartum F/U:none  Low risk pregnancy complicated by: anemia, s/p IV venofer and discharged on PO iron  Delivery mode:  Vaginal, Spontaneous  Anticipated Birth Control:  BTL done PP  Jenae Tomasello, Placido Sou, MD OB Fellow, Faculty Practice 01/21/2021 11:11 AM

## 2021-01-20 NOTE — Anesthesia Procedure Notes (Signed)
Epidural Patient location during procedure: OB Start time: 01/20/2021 1:30 AM End time: 01/20/2021 1:43 AM  Staffing Anesthesiologist: Lucretia Kern, MD Performed: anesthesiologist   Preanesthetic Checklist Completed: patient identified, IV checked, risks and benefits discussed, monitors and equipment checked, pre-op evaluation and timeout performed  Epidural Patient position: sitting Prep: DuraPrep Patient monitoring: heart rate, continuous pulse ox and blood pressure Approach: midline Location: L3-L4 Injection technique: LOR air  Needle:  Needle type: Tuohy  Needle gauge: 17 G Needle length: 9 cm Needle insertion depth: 7 cm Catheter type: closed end flexible Catheter size: 20 Guage Catheter at skin depth: 12 cm Test dose: negative and 2% lidocaine with Epi 1:200 K  Assessment Events: blood not aspirated, injection not painful, no injection resistance, no paresthesia and negative IV test  Additional Notes Reason for block:procedure for pain

## 2021-01-20 NOTE — Anesthesia Postprocedure Evaluation (Signed)
Anesthesia Post Note  Patient: Jo Graham  Procedure(s) Performed: POST PARTUM TUBAL LIGATION (N/A )     Patient location during evaluation: PACU Anesthesia Type: Epidural Level of consciousness: awake and alert Pain management: pain level controlled Vital Signs Assessment: post-procedure vital signs reviewed and stable Respiratory status: spontaneous breathing, nonlabored ventilation and respiratory function stable Cardiovascular status: blood pressure returned to baseline and stable Postop Assessment: no apparent nausea or vomiting Anesthetic complications: no   No complications documented.  Last Vitals:  Vitals:   01/20/21 1233 01/20/21 1300  BP: 108/62 107/68  Pulse: 69 70  Resp: 18 18  Temp: (!) 36.3 C 36.7 C  SpO2: 100% 100%    Last Pain:  Vitals:   01/20/21 1419  TempSrc:   PainSc: 6    Pain Goal:                   Lowella Curb

## 2021-01-20 NOTE — Anesthesia Postprocedure Evaluation (Signed)
Anesthesia Post Note  Patient: JAICE LAGUE  Procedure(s) Performed: POST PARTUM TUBAL LIGATION (N/A )     Patient location during evaluation: Mother Baby Anesthesia Type: Epidural Level of consciousness: awake and alert and oriented Pain management: satisfactory to patient Vital Signs Assessment: post-procedure vital signs reviewed and stable Respiratory status: spontaneous breathing and nonlabored ventilation Cardiovascular status: stable Postop Assessment: no headache, no backache, no signs of nausea or vomiting, adequate PO intake, patient able to bend at knees and able to ambulate (patient up walking) Anesthetic complications: no   No complications documented.  Last Vitals:  Vitals:   01/20/21 1233 01/20/21 1300  BP: 108/62 107/68  Pulse: 69 70  Resp: 18 18  Temp: (!) 36.3 C 36.7 C  SpO2: 100% 100%    Last Pain:  Vitals:   01/20/21 1419  TempSrc:   PainSc: 6    Pain Goal:                   Madison Hickman

## 2021-01-20 NOTE — Progress Notes (Signed)
Patient ID: Jo Graham, female   DOB: 12/26/1985, 35 y.o.   MRN: [id number] Patient desires bilateral tubal sterilization.  Other reversible forms of contraception were discussed with patient; she declines all other modalities. Discussed bilateral tubal sterilization in detail; discussed options of  bilateral tubal sterilization using Filshie clips, modified [place] vs  bilateral salpingectomy. Risks and benefits discussed in detail including but not limited to: risk of regret, permanence of method, bleeding, infection, injury to surrounding organs and need for additional procedures.  Failure risk of 1-2 % for Filshie clips and <1% for bilateral salpingectomy with increased risk of ectopic gestation if pregnancy occurs was also discussed with patient.  Also discussed possible reduction of risk of ovarian cancer via bilateral salpingectomy given that a growing body of knowledge reveals that the majority of cases of high grade serous "ovarian" cancer actually are actually  cancers arising from the fimbriated end of the fallopian tubes. Emphasized that removal of fallopian tubes do not result in any known hormonal imbalance.  Patient verbalized understanding of these risks and benefits and wants to proceed with sterilization with  bilateral sterilization using Filshie clips.

## 2021-01-20 NOTE — Anesthesia Preprocedure Evaluation (Signed)
Anesthesia Evaluation  Patient identified by MRN, date of birth, ID band Patient awake    Reviewed: Allergy & Precautions, H&P , NPO status , Patient's Chart, lab work & pertinent test results  History of Anesthesia Complications Negative for: history of anesthetic complications  Airway Mallampati: II  TM Distance: >3 FB Neck ROM: full    Dental no notable dental hx.    Pulmonary neg pulmonary ROS,    Pulmonary exam normal        Cardiovascular negative cardio ROS Normal cardiovascular exam Rhythm:regular Rate:Normal     Neuro/Psych negative neurological ROS  negative psych ROS   GI/Hepatic negative GI ROS, Neg liver ROS,   Endo/Other  negative endocrine ROS  Renal/GU negative Renal ROS  negative genitourinary   Musculoskeletal   Abdominal   Peds  Hematology  (+) Blood dyscrasia, anemia ,   Anesthesia Other Findings   Reproductive/Obstetrics (+) Pregnancy                             Anesthesia Physical Anesthesia Plan  ASA: II  Anesthesia Plan: Epidural   Post-op Pain Management:    Induction:   PONV Risk Score and Plan:   Airway Management Planned:   Additional Equipment:   Intra-op Plan:   Post-operative Plan:   Informed Consent: I have reviewed the patients History and Physical, chart, labs and discussed the procedure including the risks, benefits and alternatives for the proposed anesthesia with the patient or authorized representative who has indicated his/her understanding and acceptance.       Plan Discussed with:   Anesthesia Plan Comments:         Anesthesia Quick Evaluation  

## 2021-01-20 NOTE — Progress Notes (Signed)
CTBS by RN for bleeding ~200 cc when up to BR. Bleeding has decreased. On CNM exam Scant bleeding. FF U/2. BTL dressing CDI. Cytotec 800 mg Buccal given. Denies dizziness. Venofer infusion completed. Instructed pt to empty bladder Q 2 hours. Will check CBC in am.   Katrinka Blazing [place] 01/20/2021 7:43 PM

## 2021-01-20 NOTE — Op Note (Signed)
Jo Graham 01/20/2021  PREOPERATIVE DIAGNOSES: Multiparity, undesired fertility  POSTOPERATIVE DIAGNOSES: Multiparity, undesired fertility  PROCEDURE:  Postpartum Bilateral Tubal Sterilization with Filshie clips  SURGEON: Dr. Casimiro Needle L. Laurelyn Terrero  ANESTHESIA:  Epidural and local analgesia using 30 ml of 0.25% Marcaine  COMPLICATIONS:  None immediate.  ESTIMATED BLOOD LOSS: 25 ml.  FLUIDS: As Recorded  URINE OUTPUT:  As Recorded  INDICATIONS:  35 y.o. [id number] with undesired fertility,status post vaginal delivery, desires permanent sterilization.  Other reversible forms of contraception were discussed with patient; she declines all other modalities. Risks of procedure discussed with patient including but not limited to: risk of regret, permanence of method, bleeding, infection, injury to surrounding organs and need for additional procedures.  Failure risk of 1 -2 % with increased risk of ectopic gestation if pregnancy occurs was also discussed with patient.      FINDINGS:  Normal uterus, tubes, and ovaries.  PROCEDURE DETAILS: The patient was taken to the operating room where her epidural anesthesia was dosed up to surgical level and found to be adequate.  She was then placed in the dorsal supine position and prepped and draped in sterile fashion.  After an adequate timeout was performed, attention was turned to the patient's abdomen where a small transverse skin incision was made under the umbilical fold. The incision was taken down to the layer of fascia using the scalpel, and fascia was incised, and extended bilaterally using Mayo scissors. The peritoneum was entered in a sharp fashion. Attention was then turned to the patient's uterus, and left fallopian tube was identified and followed out to the fimbriated end.  A Filshie clip was placed on the left fallopian tube about 3 cm from the cornual attachment, with care given to incorporate the underlying mesosalpinx.  A similar process was  carried out on the right side allowing for bilateral tubal sterilization.  Good hemostasis was noted overall.  Local analgesia was injected into both Filshie application sites.The instruments were then removed from the patient's abdomen and the fascial incision was repaired with 0 Vicryl, and the skin was closed with a 4-0 Vicryl subcuticular stitch. The patient tolerated the procedure well.  Instrument, sponge, and needle counts were correct times two.  The patient was then taken to the recovery room awake and in stable condition.   Hermina Staggers, MD, FACOG Attending Obstetrician & Gynecologist Faculty Practice, [facility]

## 2021-01-20 NOTE — Plan of Care (Signed)
  Problem: Education: Goal: Ability to make informed decisions regarding treatment and plan of care will improve Outcome: Progressing Goal: Ability to state and carry out methods to decrease the pain will improve Outcome: Progressing Goal: Individualized Educational Video(s) Outcome: Progressing   Problem: Education: Goal: Knowledge of General Education information will improve Description: Including pain rating scale, medication(s)/side effects and non-pharmacologic comfort measures Outcome: Progressing   Problem: Health Behavior/Discharge Planning: Goal: Ability to manage health-related needs will improve Outcome: Progressing   Problem: Clinical Measurements: Goal: Ability to maintain clinical measurements within normal limits will improve Outcome: Progressing Goal: Will remain free from infection Outcome: Progressing Goal: Diagnostic test results will improve Outcome: Progressing Goal: Respiratory complications will improve Outcome: Progressing Goal: Cardiovascular complication will be avoided Outcome: Progressing   Problem: Activity: Goal: Risk for activity intolerance will decrease Outcome: Progressing   Problem: Nutrition: Goal: Adequate nutrition will be maintained Outcome: Progressing   Problem: Coping: Goal: Level of anxiety will decrease Outcome: Progressing   Problem: Elimination: Goal: Will not experience complications related to bowel motility Outcome: Progressing Goal: Will not experience complications related to urinary retention Outcome: Progressing   Problem: Pain Managment: Goal: General experience of comfort will improve Outcome: Progressing   Problem: Safety: Goal: Ability to remain free from injury will improve Outcome: Progressing   Problem: Skin Integrity: Goal: Risk for impaired skin integrity will decrease Outcome: Progressing   Problem: Education: Goal: Knowledge of Childbirth will improve Outcome: Adequate for Discharge    Problem: Coping: Goal: Ability to verbalize concerns and feelings about labor and delivery will improve Outcome: Adequate for Discharge   Problem: Life Cycle: Goal: Ability to make normal progression through stages of labor will improve Outcome: Adequate for Discharge Goal: Ability to effectively push during vaginal delivery will improve Outcome: Adequate for Discharge   Problem: Role Relationship: Goal: Will demonstrate positive interactions with the child Outcome: Adequate for Discharge   Problem: Safety: Goal: Risk of complications during labor and delivery will decrease Outcome: Adequate for Discharge   Problem: Pain Management: Goal: Relief or control of pain from uterine contractions will improve Outcome: Adequate for Discharge

## 2021-01-20 NOTE — Transfer of Care (Signed)
Immediate Anesthesia Transfer of Care Note  Patient: [name]  Procedure(s) Performed: POST PARTUM TUBAL LIGATION (N/A )  Patient Location: PACU  Anesthesia Type:Epidural  Level of Consciousness: awake, alert , oriented and patient cooperative  Airway & Oxygen Therapy: Patient Spontanous Breathing  Post-op Assessment: Report given to RN, Post -op Vital signs reviewed and stable and Patient moving all extremities X 4  Post vital signs: Reviewed and stable  Last Vitals:  Vitals Value Taken Time  BP 108/60 01/20/21 1107  Temp    Pulse 88 01/20/21 1110  Resp 20 01/20/21 1110  SpO2 98 % 01/20/21 1110  Vitals shown include unvalidated device data.  Last Pain:  Vitals:   01/20/21 0900  TempSrc: Oral  PainSc: 0-No pain         Complications: No complications documented.

## 2021-01-20 NOTE — Anesthesia Postprocedure Evaluation (Signed)
Anesthesia Post Note  Patient: [name]  Procedure(s) Performed: AN AD HOC LABOR EPIDURAL     Patient location during evaluation: Mother Baby Anesthesia Type: Epidural Level of consciousness: awake and alert Pain management: pain level controlled Vital Signs Assessment: post-procedure vital signs reviewed and stable Respiratory status: spontaneous breathing, nonlabored ventilation and respiratory function stable Cardiovascular status: stable Postop Assessment: no headache, no backache, epidural receding, no apparent nausea or vomiting, patient able to bend at knees, adequate PO intake and able to ambulate Anesthetic complications: no   No complications documented.  Last Vitals:  Vitals:   01/20/21 0700 01/20/21 0801  BP: 119/74 118/62  Pulse: 81 68  Resp:  16  Temp:  36.8 C  SpO2:  97%    Last Pain:  Vitals:   01/20/21 0801  TempSrc: Oral  PainSc:    Pain Goal:                   Laban Emperor

## 2021-01-20 NOTE — Progress Notes (Signed)
Labor Progress Note SYANN CUPPLES is a 35 y.o. [phone] at [redacted]w[redacted]d presented for IOL for SGA (11%ile at [redacted]w[redacted]d)  S: Much more uncomfortable with contractions   O:  BP 115/78   Pulse 75   Temp 98.1 F (36.7 C) (Axillary)   Resp 17   Ht 5\' 1"  (1.549 m)   Wt 94.9 kg   LMP 04/21/2020 (Exact Date)   BMI 39.53 kg/m  EFM: baseline 145/mod variability/pos accels/ no decels  Toco: q3 min  CVE: Dilation: 1 Effacement (%): Thick Cervical Position: Middle Station: -1 Presentation: Vertex Exam by:: Dr. [email or web address]   A&P: 35 y.o. 34 [redacted]w[redacted]d here for IOL for SGA #Labor: s/p cytotec x2, FB inserted 2100, inflated up to 60cc's now and still in place. Will start pitocin at this time  #Pain: per patient request #FWB: cat I  #GBS positive, give PCN #Anemia: Admit hgb 9.3. Venofer postpartum.  2101, MD 1:07 AM

## 2021-01-20 NOTE — Anesthesia Preprocedure Evaluation (Signed)
Anesthesia Evaluation  Patient identified by MRN, date of birth, ID band Patient awake    Reviewed: Allergy & Precautions, H&P , NPO status , Patient's Chart, lab work & pertinent test results  History of Anesthesia Complications Negative for: history of anesthetic complications  Airway Mallampati: II  TM Distance: >3 FB Neck ROM: full    Dental no notable dental hx.    Pulmonary neg pulmonary ROS,    Pulmonary exam normal        Cardiovascular negative cardio ROS Normal cardiovascular exam Rhythm:regular Rate:Normal     Neuro/Psych negative neurological ROS  negative psych ROS   GI/Hepatic negative GI ROS, Neg liver ROS,   Endo/Other  negative endocrine ROS  Renal/GU negative Renal ROS  negative genitourinary   Musculoskeletal negative musculoskeletal ROS (+)   Abdominal (+) + obese,   Peds negative pediatric ROS (+)  Hematology negative hematology ROS (+) anemia ,   Anesthesia Other Findings   Reproductive/Obstetrics negative OB ROS                             Anesthesia Physical  Anesthesia Plan  ASA: II  Anesthesia Plan: Epidural   Post-op Pain Management:    Induction: Intravenous  PONV Risk Score and Plan: 2 and Ondansetron, Midazolam and Treatment may vary due to age or medical condition  Airway Management Planned: Simple Face Mask  Additional Equipment:   Intra-op Plan:   Post-operative Plan:   Informed Consent: I have reviewed the patients History and Physical, chart, labs and discussed the procedure including the risks, benefits and alternatives for the proposed anesthesia with the patient or authorized representative who has indicated his/her understanding and acceptance.     Dental advisory given  Plan Discussed with: CRNA  Anesthesia Plan Comments:         Anesthesia Quick Evaluation

## 2021-01-20 NOTE — Lactation Note (Signed)
This note was copied from a baby's chart. Lactation Consultation Note Baby less than 1 hr old. Mom didn't BF her other children. Mom stated she tried but but the baby had trouble latching. Mom has great everted nipples. Doesn't appear to be an issue with this baby. Mom making faces as if doesn't like it. Mom stated it feels weird. Will f/u baby on MBU.  Patient Name: Jo Graham [id number] Date: 01/20/2021 Reason for consult: L&D Initial assessment;Term Age:77 hours  Maternal Data Has patient been taught Hand Expression?: Yes Does the patient have breastfeeding experience prior to this delivery?: Yes How long did the patient breastfeed?: a few days  Feeding    LATCH Score Latch: Grasps breast easily, tongue down, lips flanged, rhythmical sucking.  Audible Swallowing: A few with stimulation  Type of Nipple: Everted at rest and after stimulation  Comfort (Breast/Nipple): Soft / non-tender  Hold (Positioning): Assistance needed to correctly position infant at breast and maintain latch.  LATCH Score: 8   Lactation Tools Discussed/Used    Interventions Interventions: Adjust position;Assisted with latch;Support pillows;Skin to skin;Position options;Breast massage;Hand express;Breast compression  Discharge    Consult Status Consult Status: Follow-up Date: 01/20/21 Follow-up type: In-patient    Charyl Dancer 01/20/2021, 4:23 AM

## 2021-01-21 DIAGNOSIS — Z9851 Tubal ligation status: Secondary | ICD-10-CM

## 2021-01-21 LAB — CBC
HCT: 24 % — ABNORMAL LOW (ref 36.0–46.0)
Hemoglobin: 7.5 g/dL — ABNORMAL LOW (ref 12.0–15.0)
MCH: 24.5 pg — ABNORMAL LOW (ref 26.0–34.0)
MCHC: 31.3 g/dL (ref 30.0–36.0)
MCV: 78.4 fL — ABNORMAL LOW (ref 80.0–100.0)
Platelets: 213 10*3/uL (ref 150–400)
RBC: 3.06 MIL/uL — ABNORMAL LOW (ref 3.87–5.11)
RDW: 15.7 % — ABNORMAL HIGH (ref 11.5–15.5)
WBC: 12.3 10*3/uL — ABNORMAL HIGH (ref 4.0–10.5)
nRBC: 0.2 % (ref 0.0–0.2)

## 2021-01-21 MED ORDER — ACETAMINOPHEN 325 MG PO TABS: 650.0000 mg | ORAL_TABLET | Freq: Four times a day (QID) | ORAL | 0 refills | Status: DC | PRN

## 2021-01-21 MED ORDER — IBUPROFEN 600 MG PO TABS: 600.0000 mg | ORAL_TABLET | Freq: Four times a day (QID) | ORAL | 0 refills | Status: DC

## 2021-01-21 MED ORDER — ASCORBIC ACID 500 MG PO TABS: 250.0000 mg | ORAL_TABLET | ORAL | Status: DC

## 2021-01-21 MED ORDER — FERROUS SULFATE 325 (65 FE) MG PO TABS: 325.0000 mg | ORAL_TABLET | ORAL | Status: DC

## 2021-01-21 MED ORDER — COCONUT OIL OIL: 1.0000 "application " | TOPICAL_OIL | 0 refills | Status: AC | PRN

## 2021-01-21 NOTE — Discharge Instructions (Signed)

## 2021-02-20 ENCOUNTER — Ambulatory Visit: Payer: Managed Care, Other (non HMO) | Admitting: Obstetrics and Gynecology

## 2021-02-27 ENCOUNTER — Encounter: Payer: Self-pay | Admitting: Obstetrics and Gynecology

## 2021-02-27 ENCOUNTER — Ambulatory Visit (INDEPENDENT_AMBULATORY_CARE_PROVIDER_SITE_OTHER): Payer: Managed Care, Other (non HMO) | Admitting: Obstetrics and Gynecology

## 2021-02-27 ENCOUNTER — Other Ambulatory Visit: Payer: Self-pay

## 2021-02-27 LAB — CBC
Hematocrit: 34.4 % (ref 34.0–46.6)
Hemoglobin: 10.4 g/dL — ABNORMAL LOW (ref 11.1–15.9)
MCH: 24 pg — ABNORMAL LOW (ref 26.6–33.0)
MCHC: 30.2 g/dL — ABNORMAL LOW (ref 31.5–35.7)
MCV: 79 fL (ref 79–97)
Platelets: 364 10*3/uL (ref 150–450)
RBC: 4.33 x10E6/uL (ref 3.77–5.28)
RDW: 18.2 % — ABNORMAL HIGH (ref 11.7–15.4)
WBC: 7.7 10*3/uL (ref 3.4–10.8)

## 2021-02-27 NOTE — Progress Notes (Signed)
    Post Partum Visit Note  Jo Graham is a 35 y.o. [id number] female who presents for a postpartum visit. She is 5 weeks postpartum following a normal spontaneous vaginal delivery.  I have fully reviewed the prenatal and intrapartum course. The delivery was at [redacted]w[redacted]d gestational weeks.  Anesthesia: epidural and local analgesia using 86ml of 0.25% Marcaine. Postpartum course has been unremarkable except for anemia. She received Venofor while in the hospital. Pecola Leisure is doing well. Baby is feeding by both breast and bottle - Gerber . Bleeding staining only. Bowel function is normal. Bladder function is normal. Patient is not sexually active. Contraception method is tubal ligation. Postpartum depression screening: negative.   The pregnancy intention screening data noted above was reviewed. Potential methods of contraception were discussed. The patient elected to proceed with No data recorded.    Health Maintenance Due  Topic Date Due   COVID-19 Vaccine (1) Never done    Medical record reviewed  Review of Systems Pertinent items noted in HPI and remainder of comprehensive ROS otherwise negative.  Objective:  LMP 04/21/2020 (Exact Date)    General:  alert   Breasts:  not indicated  Lungs: clear to auscultation bilaterally  Heart:  regular rate and rhythm, S1, S2 normal, no murmur, click, rub or gallop  Abdomen: normal  Wound NA  GU exam:  not indicated       Assessment:    There are no diagnoses linked to this encounter.  Nl postpartum exam.  Anemia  Plan:   Essential components of care per ACOG recommendations:  1.  Mood and well being: Patient with negative depression screening today. Reviewed local resources for support.  - Patient tobacco use? No.   - hx of drug use? No.    2. Infant care and feeding:  -Patient currently breastmilk feeding? No.  -Social determinants of health (SDOH) reviewed in EPIC. No concerns  3. Sexuality, contraception and birth spacing - Pt had  BTL postpartum  4. Sleep and fatigue -Encouraged family/partner/community support of 4 hrs of uninterrupted sleep to help with mood and fatigue  5. Physical Recovery  - Discussed patients delivery and complications. She describes her labor as good. - Patient had a Vaginal, no problems at delivery. Patient had a 1st degree laceration. Perineal healing reviewed. Patient expressed understanding - Patient has urinary incontinence? No. - Patient is safe to resume physical and sexual activity  6.  Health Maintenance - HM due items addressed Yes - Last pap smear  Diagnosis  Date Value Ref Range Status  06/27/2020   Final   - Negative for intraepithelial lesion or malignancy (NILM)   Pap smear not done at today's visit.  -Breast Cancer screening indicated? No.   7. Chronic Disease/Pregnancy Condition follow up: Anemia  Will check CBC today  Nettie Elm, MD [facility] [employer], [facility]

## 2021-02-27 NOTE — Patient Instructions (Signed)
Health Maintenance, Female Adopting a healthy lifestyle and getting preventive care are important in promoting health and wellness. Ask your health care provider about: The right schedule for you to have regular tests and exams. Things you can do on your own to prevent diseases and keep yourself healthy. What should I know about diet, weight, and exercise? Eat a healthy diet  Eat a diet that includes plenty of vegetables, fruits, low-fat dairy products, and lean protein. Do not eat a lot of foods that are high in solid fats, added sugars, or sodium.  Maintain a healthy weight Body mass index (BMI) is used to identify weight problems. It estimates body fat based on height and weight. Your health care provider can help determineyour BMI and help you achieve or maintain a healthy weight. Get regular exercise Get regular exercise. This is one of the most important things you can do for your health. Most adults should: Exercise for at least 150 minutes each week. The exercise should increase your heart rate and make you sweat (moderate-intensity exercise). Do strengthening exercises at least twice a week. This is in addition to the moderate-intensity exercise. Spend less time sitting. Even light physical activity can be beneficial. Watch cholesterol and blood lipids Have your blood tested for lipids and cholesterol at 33 years of age, then havethis test every 5 years. Have your cholesterol levels checked more often if: Your lipid or cholesterol levels are high. You are older than 33 years of age. You are at high risk for heart disease. What should I know about cancer screening? Depending on your health history and family history, you may need to have cancer screening at various ages. This may include screening for: Breast cancer. Cervical cancer. Colorectal cancer. Skin cancer. Lung cancer. What should I know about heart disease, diabetes, and high blood pressure? Blood pressure and heart  disease High blood pressure causes heart disease and increases the risk of stroke. This is more likely to develop in people who have high blood pressure readings, are of African descent, or are overweight. Have your blood pressure checked: Every 3-5 years if you are 18-39 years of age. Every year if you are 40 years old or older. Diabetes Have regular diabetes screenings. This checks your fasting blood sugar level. Have the screening done: Once every three years after age 40 if you are at a normal weight and have a low risk for diabetes. More often and at a younger age if you are overweight or have a high risk for diabetes. What should I know about preventing infection? Hepatitis B If you have a higher risk for hepatitis B, you should be screened for this virus. Talk with your health care provider to find out if you are at risk forhepatitis B infection. Hepatitis C Testing is recommended for: Everyone born from 1945 through 1965. Anyone with known risk factors for hepatitis C. Sexually transmitted infections (STIs) Get screened for STIs, including gonorrhea and chlamydia, if: You are sexually active and are younger than 33 years of age. You are older than 33 years of age and your health care provider tells you that you are at risk for this type of infection. Your sexual activity has changed since you were last screened, and you are at increased risk for chlamydia or gonorrhea. Ask your health care provider if you are at risk. Ask your health care provider about whether you are at high risk for HIV. Your health care provider may recommend a prescription medicine to help   prevent HIV infection. If you choose to take medicine to prevent HIV, you should first get tested for HIV. You should then be tested every 3 months for as long as you are taking the medicine. Pregnancy If you are about to stop having your period (premenopausal) and you may become pregnant, seek counseling before you get  pregnant. Take 400 to 800 micrograms (mcg) of folic acid every day if you become pregnant. Ask for birth control (contraception) if you want to prevent pregnancy. Osteoporosis and menopause Osteoporosis is a disease in which the bones lose minerals and strength with aging. This can result in bone fractures. If you are 65 years old or older, or if you are at risk for osteoporosis and fractures, ask your health care provider if you should: Be screened for bone loss. Take a calcium or vitamin D supplement to lower your risk of fractures. Be given hormone replacement therapy (HRT) to treat symptoms of menopause. Follow these instructions at home: Lifestyle Do not use any products that contain nicotine or tobacco, such as cigarettes, e-cigarettes, and chewing tobacco. If you need help quitting, ask your health care provider. Do not use street drugs. Do not share needles. Ask your health care provider for help if you need support or information about quitting drugs. Alcohol use Do not drink alcohol if: Your health care provider tells you not to drink. You are pregnant, may be pregnant, or are planning to become pregnant. If you drink alcohol: Limit how much you use to 0-1 drink a day. Limit intake if you are breastfeeding. Be aware of how much alcohol is in your drink. In the [place] one drink equals one 12 oz bottle of beer (355 mL), one 5 oz glass of wine (148 mL), or one 1 oz glass of hard liquor (44 mL). General instructions Schedule regular health, dental, and eye exams. Stay current with your vaccines. Tell your health care provider if: You often feel depressed. You have ever been abused or do not feel safe at home. Summary Adopting a healthy lifestyle and getting preventive care are important in promoting health and wellness. Follow your health care provider's instructions about healthy diet, exercising, and getting tested or screened for diseases. Follow your health care provider's  instructions on monitoring your cholesterol and blood pressure. This information is not intended to replace advice given to you by your health care provider. Make sure you discuss any questions you have with your healthcare provider. Document Revised: 07/28/2018 Document Reviewed: 07/28/2018 Elsevier Patient Education  2022 Elsevier Inc.  

## 2021-05-25 ENCOUNTER — Encounter: Payer: Self-pay | Admitting: Radiology

## 2021-06-03 IMAGING — US US MFM OB FOLLOW-UP
1 series · 13 of 28 positions shown · non-contrast
Comparison: none

[Series 1: us mfm ob follow-up · 13 of 70 slices shown]
[im 3/70]
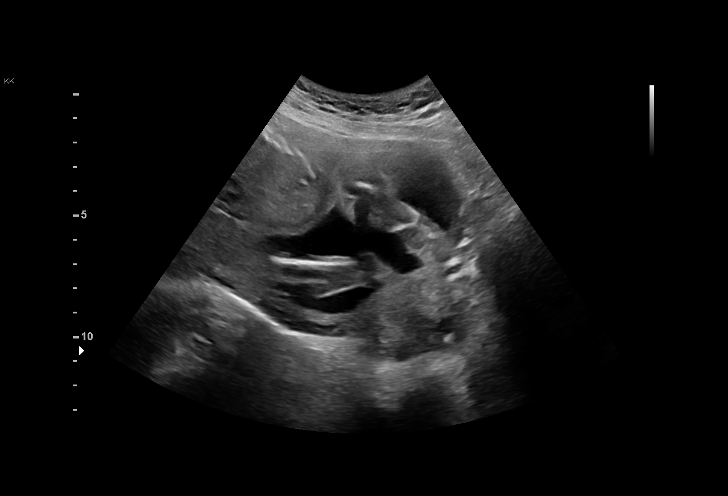
[im 8/70]
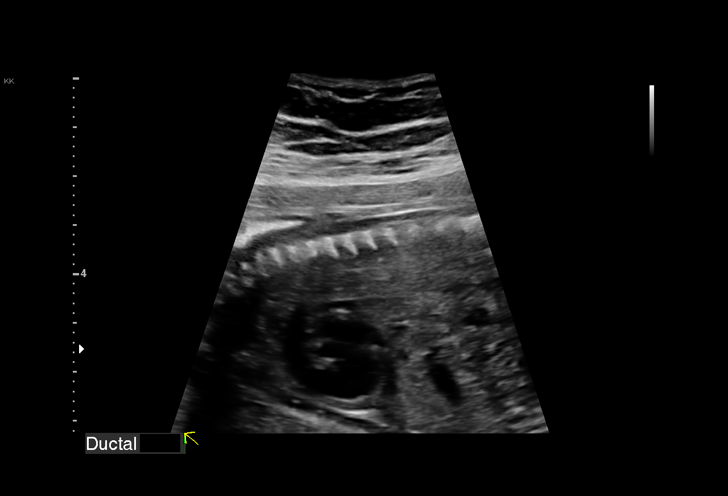
[im 13/70]
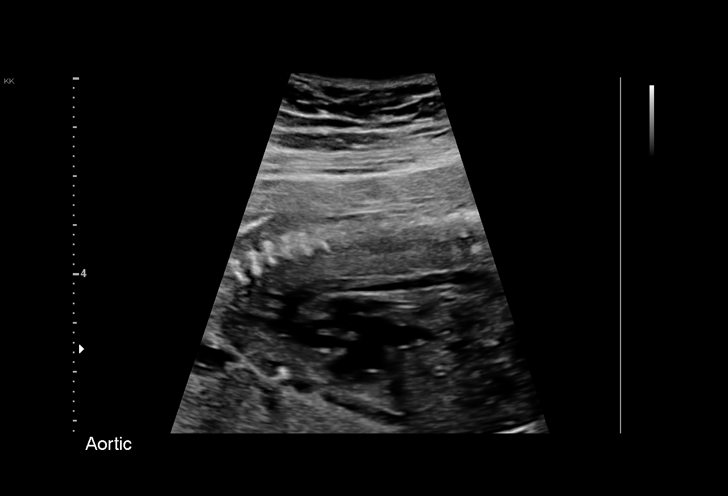
[im 18/70]
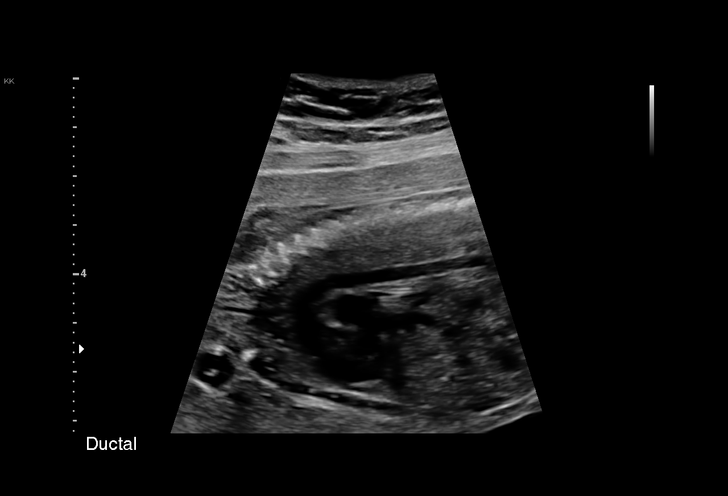
[im 24/70]
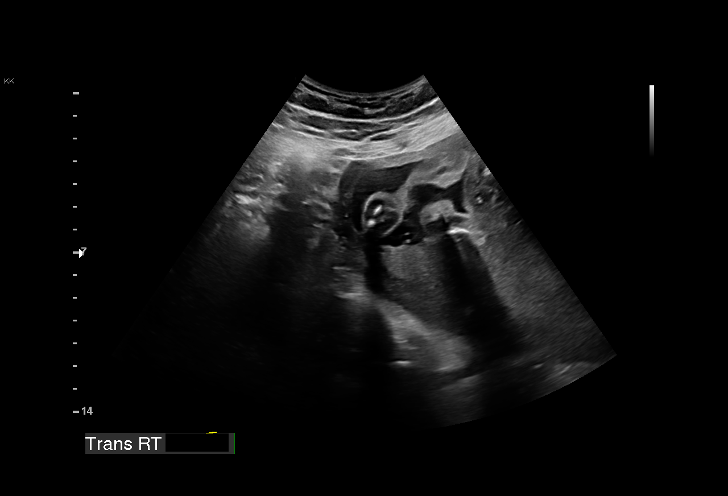
[im 29/70]
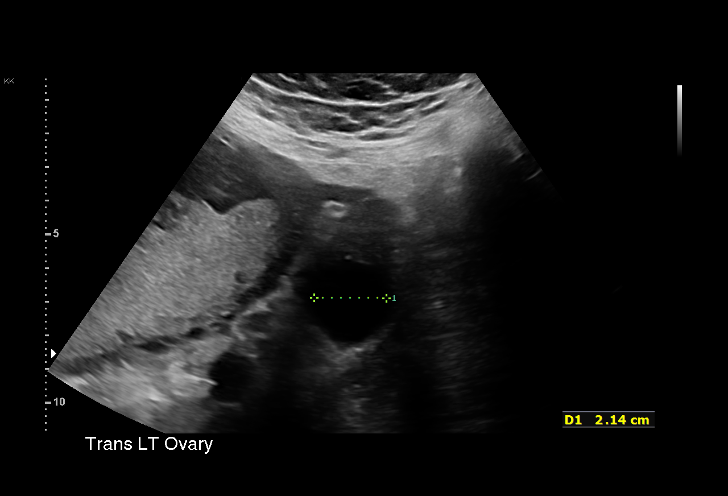
[im 36/70]
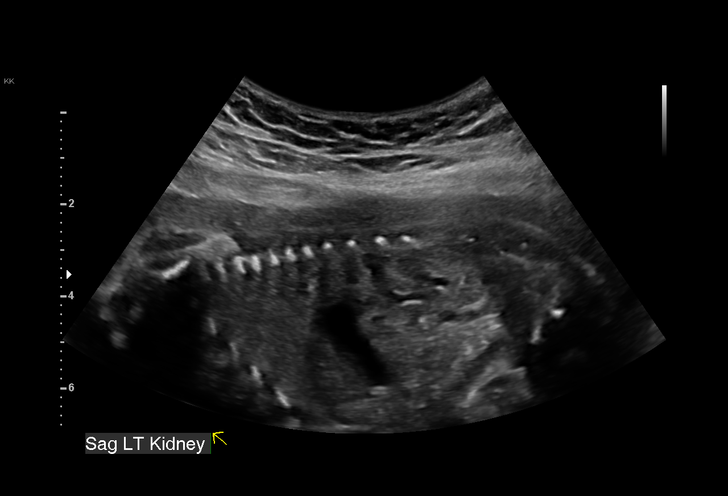
[im 41/70]
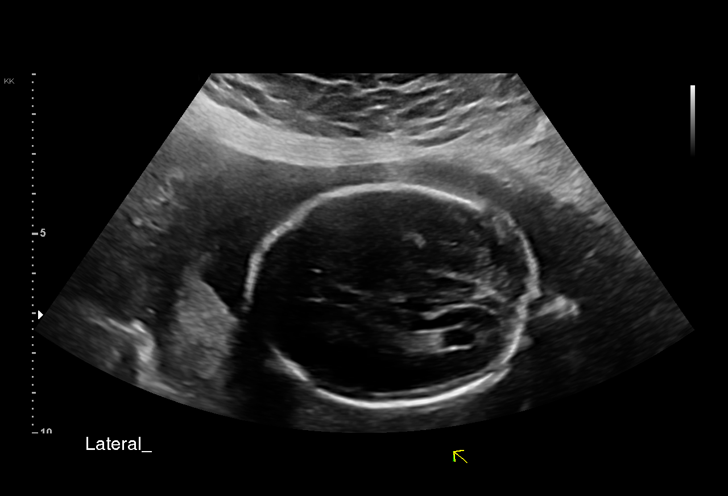
[im 47/70]
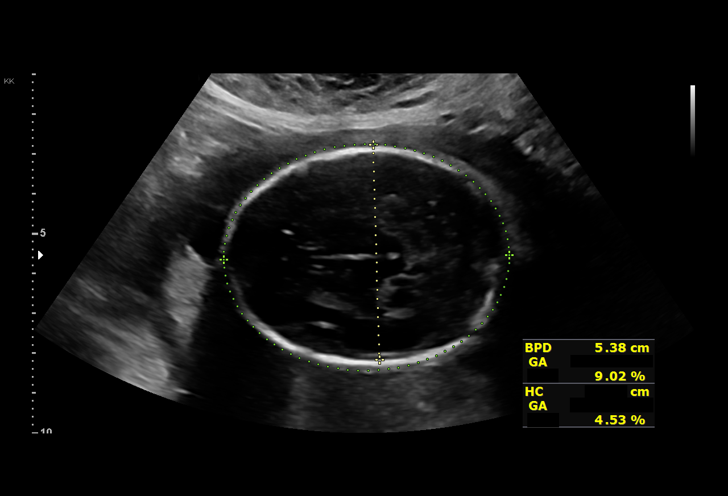
[im 52/70]
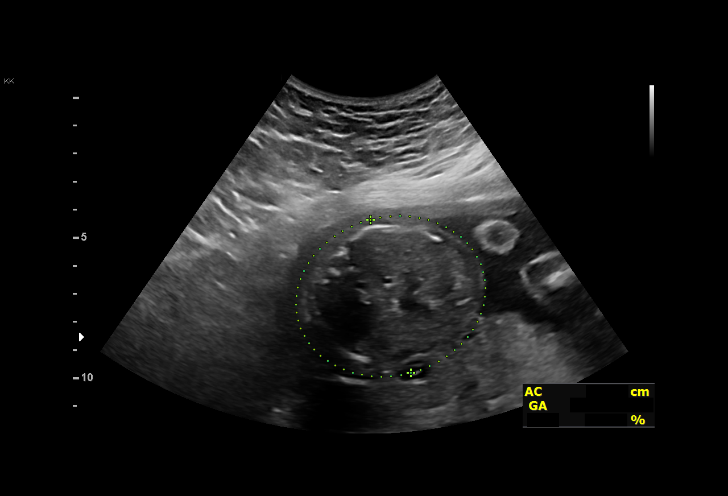
[im 57/70]
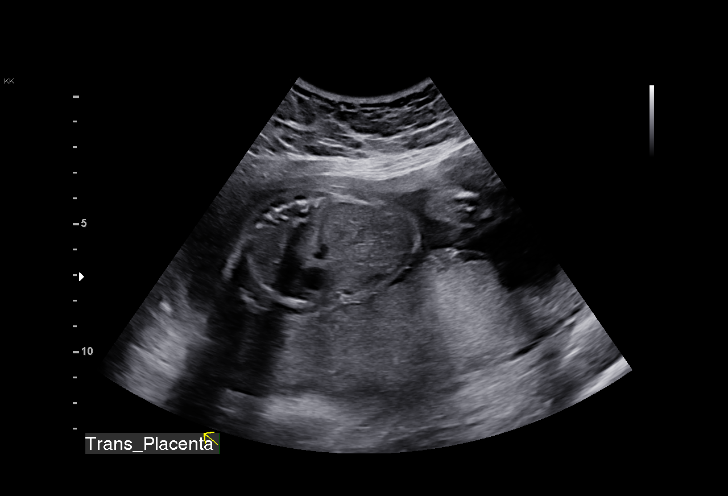
[im 62/70]
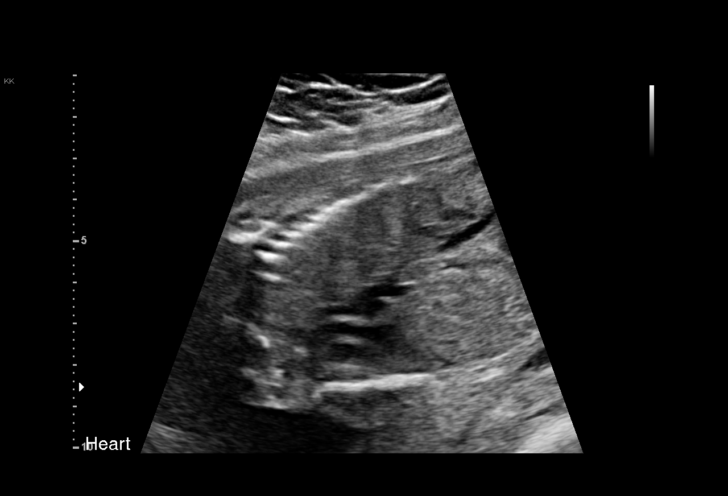
[im 67/70]
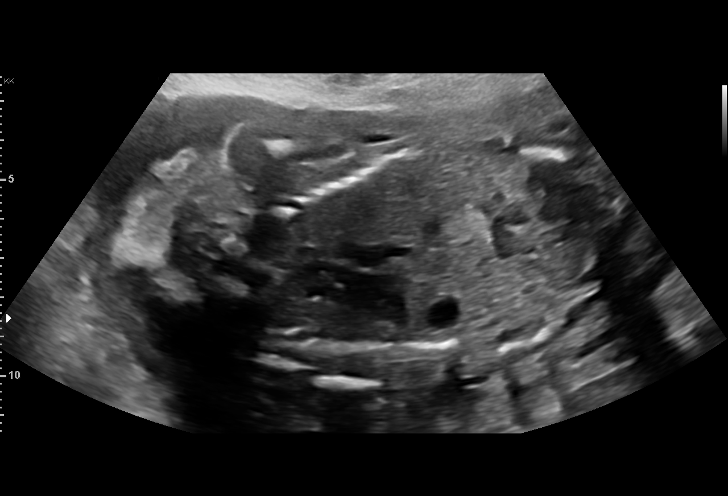

[13 of 28 positions shown; findings below may reference images not displayed]

Indications

 Obesity complicating pregnancy (BMI 41.59)
 Poor obstetric history: Previous preterm
 delivery, antepartum(36 wks)
 LOW Risk NIPS, Negative AFP, Negative
 [facility])
 23 weeks gestation of pregnancy
 Ovarian cyst (simple)
 Antenatal follow-up for nonvisualized fetal
 anatomy
 Fetal abnormality - other known or
 suspected (Echogenic Foci in stomach)
Vital Signs

 BMI:
Fetal Evaluation

 Num Of Fetuses:         1
 Fetal Heart Rate(bpm):  158
 Cardiac Activity:       Observed
 Presentation:           Breech
 Placenta:               Posterior Fundal
 P. Cord Insertion:      Previously Visualized
 Amniotic Fluid
 AFI FV:      Within normal limits

                             Largest Pocket(cm)

Biometry

 BPD:      53.9  mm     G. Age:  22w 3d          9  %    CI:        69.38   %    70 - 86
                                                         FL/HC:      20.5   %    18.7 -
 HC:      206.6  mm     G. Age:  22w 5d         10  %    HC/AC:      1.09        1.05 -
 AC:      189.9  mm     G. Age:  23w 5d         47  %    FL/BPD:     78.5   %    71 - 87
 FL:       42.3  mm     G. Age:  23w 6d         45  %    FL/AC:      22.3   %    20 - 24

 Est. FW:     610  gm      1 lb 6 oz     43  %
OB History

 Gravidity:    5         Term:   1        Prem:   1        SAB:   1
 TOP:          1       Ectopic:  1        Living: 2
Gestational Age

 LMP:           23w 4d        Date:  04/21/20                 EDD:   01/26/21
 U/S Today:     23w 1d                                        EDD:   01/29/21
 Best:          23w 4d     Det. By:  LMP  (04/21/20)          EDD:   01/26/21
Anatomy

 Cranium:               Appears normal         LVOT:                   Previously seen
 Cavum:                 Appears normal         Aortic Arch:            Appears normal
 Ventricles:            Appears normal         Ductal Arch:            Appears normal
 Choroid Plexus:        Previously seen        Diaphragm:              Appears normal
 Cerebellum:            Appears normal         Stomach:                Appears normal, left
                                                                       sided
 Posterior Fossa:       Appears normal         Abdomen:                Echogenic Focus
 Nuchal Fold:           Previously seen        Abdominal Wall:         Previously seen
 Face:                  Orbits and profile     Cord Vessels:           Previously seen
                        previously seen
 Lips:                  Previously seen        Kidneys:                Appear normal
 Palate:                Not well visualized    Bladder:                Appears normal
 Thoracic:              Appears normal         Spine:                  Previously seen
 Heart:                 Previously seen        Upper Extremities:      Previously seen
 RVOT:                  Previously seen        Lower Extremities:      Previously seen

 Other:  Fetus appears to be female. Heels previously visualized. Technically
         difficult due to fetal position.
Cervix Uterus Adnexa

 Cervix
 Length:              3  cm.
 Normal appearance by transabdominal scan.

 Right Ovary
 Not visualized.
 Left Ovary
 Simple cyst measuring <3cm

 Adnexa
 No abnormality visualized.
Impression

 Follow up growth due to elevated BMI >40 and prior
 echogenic focus.
 Normal interval growth with measurements consistent with
 dates
 Good fetal movement and amniotic fluid volume
 Simple cyst was again seen but decreased in size to <3 cm.

 EIF is not well visualized today.
Recommendations

 Follow up growth is scheduled in 5-6 weeks given elevated
 BMI.

## 2023-01-17 ENCOUNTER — Emergency Department (HOSPITAL_BASED_OUTPATIENT_CLINIC_OR_DEPARTMENT_OTHER)
Admission: EM | Admit: 2023-01-17 | Discharge: 2023-01-18 | Disposition: A | Discharge status: Home / Self Care | Payer: Managed Care, Other (non HMO) | Attending: Emergency Medicine | Admitting: Emergency Medicine

## 2023-01-17 ENCOUNTER — Encounter (HOSPITAL_BASED_OUTPATIENT_CLINIC_OR_DEPARTMENT_OTHER): Payer: Self-pay | Admitting: Emergency Medicine

## 2023-01-17 ENCOUNTER — Emergency Department (HOSPITAL_BASED_OUTPATIENT_CLINIC_OR_DEPARTMENT_OTHER): Payer: Managed Care, Other (non HMO) | Admitting: Radiology

## 2023-01-17 ENCOUNTER — Emergency Department (HOSPITAL_BASED_OUTPATIENT_CLINIC_OR_DEPARTMENT_OTHER): Payer: Managed Care, Other (non HMO)

## 2023-01-17 ENCOUNTER — Other Ambulatory Visit: Payer: Self-pay

## 2023-01-17 DIAGNOSIS — M79661 Pain in right lower leg: Secondary | ICD-10-CM | POA: Diagnosis present

## 2023-01-17 DIAGNOSIS — W19XXXA Unspecified fall, initial encounter: Secondary | ICD-10-CM | POA: Insufficient documentation

## 2023-01-17 DIAGNOSIS — S86911A Strain of unspecified muscle(s) and tendon(s) at lower leg level, right leg, initial encounter: Secondary | ICD-10-CM | POA: Diagnosis not present

## 2023-01-17 NOTE — ED Triage Notes (Signed)
Trip/fall Happened around 7:30pm Pain in back of thigh knee. "Feels like something tightened" Able to move hip thigh, able to move ankle +pedal pulse +cap refill Denies any other injury, no loc

## 2023-01-17 NOTE — ED Provider Notes (Signed)
[facility] Provider Note: Lowella Dell, MD, FACEP  CSN: [id number] MRN: [id number] ARRIVAL: 01/17/23 at 2121 ROOM: DB016/DB016   CHIEF COMPLAINT  Fall   HISTORY OF PRESENT ILLNESS  01/17/23 11:37 PM Jo Graham is a 37 y.o. female who fell about 7:30 PM.  She is having pain in the right lower extremity, primarily in the posterior aspect of the right knee and thigh.  She states it feels like something in her leg is tight.  She is able to move her hip and her ankle but range of motion of the knee is decreased due to pain.  She rates her pain as a 7 out of 10.  She denies other injury.   Past Medical History:  Diagnosis Date   H/O candidiasis    H/O cystitis    x1   Supervision of high risk pregnancy, antepartum 12/01/2011    Nursing Staff Provider  Office Location  [facility] Dating  LMP  Language  Eng Anatomy US  Echogenic focus in stomach/stomach wall, simple left ovarian cyst, incomplete - needs f/u, serial growth recommended d/t obesity  Flu Vaccine  Declined  Genetic Screen  NIPS: low risk female  AFP:   Negative   TDaP Vaccine   11/01/20 Hgb A1C or  GTT Early  Third trimester   COVID Vaccine no   LAB RESULTS   Rhogam     Past Surgical History:  Procedure Laterality Date   DILATION AND EVACUATION N/A 01/25/2020   Procedure: DILATATION AND EVACUATION;  Surgeon: Deering Bing, MD;  Location: [facility];  Service: Gynecology;  Laterality: N/A;   TUBAL LIGATION N/A 01/20/2021   Procedure: POST PARTUM TUBAL LIGATION;  Surgeon: Hermina Staggers, MD;  Location: [facility];  Service: Gynecology;  Laterality: N/A;    Family History  Problem Relation Age of Onset   Asthma Maternal Aunt    Cancer Maternal Aunt     Social History   Tobacco Use   Smoking status: Never   Smokeless tobacco: Never  Vaping Use   Vaping Use: Never used  Substance Use Topics   Alcohol use: No   Drug use: No    Prior to Admission medications   Medication Sig Start Date End Date  Taking? Authorizing Provider  naproxen (NAPROSYN) 375 MG tablet Take 1 tablet twice daily as needed for pain. 01/18/23  Yes Melissia Lahman, MD  coconut oil OIL Apply 1 application topically as needed (nipple pain). 01/21/21   Gita Kudo, MD  Prenatal Vit-Fe Fumarate-FA (MULTIVITAMIN-PRENATAL) 27-0.8 MG TABS tablet Take 1 tablet by mouth daily at 12 noon.     [provider]  promethazine (PHENERGAN) 25 MG tablet Take 1 tablet (25 mg total) by mouth every 6 (six) hours as needed for nausea or vomiting. 10/22/16 06/27/20  Judeth Horn, NP    Allergies Patient has no known allergies.   REVIEW OF SYSTEMS  Negative except as noted here or in the History of Present Illness.   PHYSICAL EXAMINATION  Initial Vital Signs Blood pressure (!) 139/109, pulse 96, temperature 98.4 F (36.9 C), temperature source Oral, resp. rate 19, last menstrual period 01/09/2023, SpO2 100 %, currently breastfeeding.  Examination General: Well-developed, well-nourished female in no acute distress; appearance consistent with age of record HENT: normocephalic; atraumatic Eyes: Normal appearance Neck: supple Heart: regular rate and rhythm Lungs: clear to auscultation bilaterally Abdomen: soft; nondistended; nontender; bowel sounds present Extremities: No deformity; tenderness of right posterior knee and thigh with  ecchymoses of the right lateral lower leg; pulses normal Neurologic: Awake, alert and oriented; motor function intact in all extremities and symmetric; no facial droop Skin: Warm and dry Psychiatric: Normal mood and affect   RESULTS  Summary of this visit's results, reviewed and interpreted by myself:   EKG Interpretation  Date/Time:    Ventricular Rate:    PR Interval:    QRS Duration:   QT Interval:    QTC Calculation:   R Axis:     Text Interpretation:         Laboratory Studies: No results found for this or any previous visit (from the past 24 hour(s)). Imaging Studies: DG  Tibia/Fibula Right  Result Date: 01/18/2023 CLINICAL DATA:  Fall, pain. EXAM: RIGHT TIBIA AND FIBULA - 2 VIEW COMPARISON:  None Available. FINDINGS: There is no evidence of fracture or other focal bone lesions. Cortical margins of the tibia and fibular intact. Subcutaneous edema about the anterior aspect of the infrapatellar region. IMPRESSION: Soft tissue edema without fracture. Electronically Signed   By: Narda Rutherford M.D.   On: 01/18/2023 00:35   DG Femur Min 2 Views Right  Result Date: 01/18/2023 CLINICAL DATA:  Fall, pain. EXAM: RIGHT FEMUR 2 VIEWS COMPARISON:  None Available. FINDINGS: No evidence of acute femur fracture. There is a possible posterior acetabular wall fracture. Femoral head is well seated in the acetabulum. Knee alignment is maintained. IMPRESSION: No femur fracture. Possible posterior acetabular wall fracture. Recommend hip CT. Electronically Signed   By: Narda Rutherford M.D.   On: 01/18/2023 00:34    ED COURSE and MDM  Nursing notes, initial and subsequent vitals signs, including pulse oximetry, reviewed and interpreted by myself.  Vitals:   01/17/23 2129 01/18/23 0035  BP: (!) 139/109 112/71  Pulse: 96 (!) 102  Resp: 19 17  Temp: 98.4 F (36.9 C)   TempSrc: Oral   SpO2: 100% 96%   Medications  naproxen (NAPROSYN) tablet 500 mg (has no administration in time range)   The patient has no pain on movement or weightbearing of the right hip and there is no tenderness on palpation of the right hip.  I do not believe she has an acetabular wall fracture.  We will place her right leg in a knee immobilizer and refer to orthopedics if symptoms persist.  She does not wish to use crutches.  PROCEDURES  Procedures   ED DIAGNOSES     ICD-10-CM   1. Fall, initial encounter  W19.XXXA     2. Strain of knee and leg, right, initial encounter  Z61.096E          Paula Libra, MD 01/18/23 0101

## 2023-01-18 ENCOUNTER — Emergency Department (HOSPITAL_BASED_OUTPATIENT_CLINIC_OR_DEPARTMENT_OTHER): Payer: Managed Care, Other (non HMO) | Admitting: Radiology

## 2023-01-18 DIAGNOSIS — S86911A Strain of unspecified muscle(s) and tendon(s) at lower leg level, right leg, initial encounter: Secondary | ICD-10-CM | POA: Diagnosis not present

## 2023-01-18 MED ORDER — NAPROXEN 375 MG PO TABS: ORAL_TABLET | ORAL | 0 refills | Status: AC

## 2023-01-18 MED ORDER — NAPROXEN 250 MG PO TABS
500.0000 mg | ORAL_TABLET | Freq: Once | ORAL | Status: AC
  Administered 2023-01-18: 500 mg via ORAL
  Filled 2023-01-18: qty 2

## 2023-01-20 ENCOUNTER — Emergency Department (HOSPITAL_BASED_OUTPATIENT_CLINIC_OR_DEPARTMENT_OTHER)
Admission: EM | Admit: 2023-01-20 | Discharge: 2023-01-20 | Disposition: A | Discharge status: Home / Self Care | Payer: Managed Care, Other (non HMO) | Attending: Emergency Medicine | Admitting: Emergency Medicine

## 2023-01-20 ENCOUNTER — Other Ambulatory Visit: Payer: Self-pay

## 2023-01-20 ENCOUNTER — Encounter (HOSPITAL_BASED_OUTPATIENT_CLINIC_OR_DEPARTMENT_OTHER): Payer: Self-pay

## 2023-01-20 DIAGNOSIS — M25561 Pain in right knee: Secondary | ICD-10-CM | POA: Insufficient documentation

## 2023-01-20 DIAGNOSIS — S8011XA Contusion of right lower leg, initial encounter: Secondary | ICD-10-CM | POA: Insufficient documentation

## 2023-01-20 DIAGNOSIS — S8991XA Unspecified injury of right lower leg, initial encounter: Secondary | ICD-10-CM | POA: Diagnosis present

## 2023-01-20 DIAGNOSIS — W19XXXA Unspecified fall, initial encounter: Secondary | ICD-10-CM | POA: Insufficient documentation

## 2023-01-20 NOTE — ED Provider Notes (Signed)
[place] EMERGENCY DEPARTMENT AT [facility] Provider Note   CSN: [id number] Arrival date & time: 01/20/23  2001     History  Chief Complaint  Patient presents with   Leg Pain    Jo Graham is a 37 y.o. female.   Leg Pain  Patient is a 37 year old female present emergency room today with request for crutches.  She injured her knee 4 days ago and was seen in the emergency room yesterday and had a normal x-ray of her knee and ultimately diagnosed with knee strain and recommended crutches and hinged knee brace and orthopedic follow-up.  She returns the ER today with no new symptoms no new injuries states that she has had some improvement in her pain with Aleve.  She is requesting crutches.      Home Medications Prior to Admission medications   Medication Sig Start Date End Date Taking? Authorizing Provider  coconut oil OIL Apply 1 application topically as needed (nipple pain). 01/21/21   Gita Kudo, MD  naproxen (NAPROSYN) 375 MG tablet Take 1 tablet twice daily as needed for pain. 01/18/23   Molpus, John, MD  Prenatal Vit-Fe Fumarate-FA (MULTIVITAMIN-PRENATAL) 27-0.8 MG TABS tablet Take 1 tablet by mouth daily at 12 noon.     [provider]  promethazine (PHENERGAN) 25 MG tablet Take 1 tablet (25 mg total) by mouth every 6 (six) hours as needed for nausea or vomiting. 10/22/16 06/27/20  Judeth Horn, NP      Allergies    Patient has no known allergies.    Review of Systems   Review of Systems  Physical Exam Updated Vital Signs BP 101/86 (BP Location: Left Arm)   Pulse 88   Temp (!) 97 F (36.1 C) (Temporal)   Resp 18   Ht 5\' 1"  (1.549 m)   Wt 91.4 kg   LMP 01/09/2023   SpO2 100%   BMI 38.07 kg/m  Physical Exam Vitals and nursing note reviewed.  Constitutional:      General: She is not in acute distress.    Appearance: Normal appearance. She is not ill-appearing.  HENT:     Head: Normocephalic and atraumatic.  Eyes:     General: No  scleral icterus.       Right eye: No discharge.        Left eye: No discharge.     Conjunctiva/sclera: Conjunctivae normal.  Pulmonary:     Effort: Pulmonary effort is normal.     Breath sounds: No stridor.  Musculoskeletal:     Comments: No focal bony tenderness of right lower extremity.  Skin:    General: Skin is warm and dry.     Comments: Bruising over the lateral aspect of the right proximal lower leg just below the knee  Neurological:     Mental Status: She is alert and oriented to person, place, and time. Mental status is at baseline.     ED Results / Procedures / Treatments   Labs (all labs ordered are listed, but only abnormal results are displayed) Labs Reviewed - No data to display  EKG None  Radiology No results found.  Procedures Procedures    Medications Ordered in ED Medications - No data to display  ED Course/ Medical Decision Making/ A&P                             Medical Decision Making  Patient is a 37 year old female present  emergency room today with request for crutches.  She injured her knee 4 days ago and was seen in the emergency room yesterday and had a normal x-ray of her knee and ultimately diagnosed with knee strain and recommended crutches and hinged knee brace and orthopedic follow-up.  She returns the ER today with no new symptoms no new injuries states that she has had some improvement in her pain with Aleve.  She is requesting crutches.  Will provide her with this  Patient has reassuring physical exam I agree of prior diagnosis.  I reviewed patient's x-ray from yesterday.  I agree with radiologist read no fracture.  Will discharge home at this time with crutches.  Final Clinical Impression(s) / ED Diagnoses Final diagnoses:  Acute pain of right knee    Rx / DC Orders ED Discharge Orders     None         Gailen Shelter, [place] 01/20/23 2138    Ernie Avena, MD 01/20/23 2143

## 2023-01-20 NOTE — ED Triage Notes (Signed)
Patient here POV from Home.  Endorses Fall Saturday. Seen and Evaluated. Left but declined Crutches. Returns for Crutches.  NAD Noted during Triage. A&Ox4. GCS 15. BIB Wheelchair.
